# Patient Record
Sex: Male | Born: 1985 | Race: White | Hispanic: No | Marital: Single | Smoking: Current every day smoker
Health system: Southern US, Community
[De-identification: ages and names within clinical notes are randomized; demographics above are authoritative.]

## PROBLEM LIST (undated history)

## (undated) DIAGNOSIS — K5090 Crohn's disease, unspecified, without complications: Secondary | ICD-10-CM

## (undated) DIAGNOSIS — I10 Essential (primary) hypertension: Secondary | ICD-10-CM

## (undated) HISTORY — PX: BACK SURGERY: SHX140

## (undated) HISTORY — PX: OTHER SURGICAL HISTORY: SHX169

---
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
## 2012-04-12 ENCOUNTER — Ambulatory Visit (INDEPENDENT_AMBULATORY_CARE_PROVIDER_SITE_OTHER): Payer: Self-pay | Admitting: General Surgery

## 2012-04-21 ENCOUNTER — Encounter (HOSPITAL_COMMUNITY): Payer: Self-pay | Admitting: Emergency Medicine

## 2012-04-21 ENCOUNTER — Emergency Department (HOSPITAL_COMMUNITY)
Admission: EM | Admit: 2012-04-21 | Discharge: 2012-04-21 | Disposition: A | Discharge status: Home / Self Care | Payer: Managed Care, Other (non HMO) | Attending: Emergency Medicine | Admitting: Emergency Medicine

## 2012-04-21 ENCOUNTER — Emergency Department (HOSPITAL_COMMUNITY): Payer: Managed Care, Other (non HMO)

## 2012-04-21 DIAGNOSIS — R109 Unspecified abdominal pain: Secondary | ICD-10-CM

## 2012-04-21 DIAGNOSIS — R110 Nausea: Secondary | ICD-10-CM

## 2012-04-21 DIAGNOSIS — I10 Essential (primary) hypertension: Secondary | ICD-10-CM | POA: Insufficient documentation

## 2012-04-21 DIAGNOSIS — Z87891 Personal history of nicotine dependence: Secondary | ICD-10-CM | POA: Insufficient documentation

## 2012-04-21 DIAGNOSIS — Z8719 Personal history of other diseases of the digestive system: Secondary | ICD-10-CM | POA: Insufficient documentation

## 2012-04-21 DIAGNOSIS — R1031 Right lower quadrant pain: Secondary | ICD-10-CM | POA: Insufficient documentation

## 2012-04-21 DIAGNOSIS — Z79899 Other long term (current) drug therapy: Secondary | ICD-10-CM | POA: Insufficient documentation

## 2012-04-21 HISTORY — DX: Essential (primary) hypertension: I10

## 2012-04-21 HISTORY — DX: Crohn's disease, unspecified, without complications: K50.90

## 2012-04-21 LAB — CBC WITH DIFFERENTIAL/PLATELET
Basophils Absolute: 0.1 10*3/uL (ref 0.0–0.1)
HCT: 43.9 % (ref 39.0–52.0)
Hemoglobin: 14.5 g/dL (ref 13.0–17.0)
Lymphocytes Relative: 17 % (ref 12–46)
Lymphs Abs: 2 10*3/uL (ref 0.7–4.0)
MCV: 87.3 fL (ref 78.0–100.0)
Monocytes Absolute: 1.4 10*3/uL — ABNORMAL HIGH (ref 0.1–1.0)
Neutro Abs: 8 10*3/uL — ABNORMAL HIGH (ref 1.7–7.7)
RBC: 5.03 MIL/uL (ref 4.22–5.81)
RDW: 13.7 % (ref 11.5–15.5)
WBC: 11.6 10*3/uL — ABNORMAL HIGH (ref 4.0–10.5)

## 2012-04-21 LAB — COMPREHENSIVE METABOLIC PANEL
ALT: 17 U/L (ref 0–53)
AST: 19 U/L (ref 0–37)
CO2: 23 mEq/L (ref 19–32)
Chloride: 102 mEq/L (ref 96–112)
Creatinine, Ser: 0.69 mg/dL (ref 0.50–1.35)
GFR calc Af Amer: >90 mL/min (ref >90)
GFR calc non Af Amer: >90 mL/min (ref >90)
Glucose, Bld: 83 mg/dL (ref 70–99)
Total Bilirubin: 0.9 mg/dL (ref 0.3–1.2)

## 2012-04-21 MED ORDER — ONDANSETRON 4 MG PO TBDP
8.0000 mg | ORAL_TABLET | Freq: Once | ORAL | Status: AC
  Administered 2012-04-21: 8 mg via ORAL
  Filled 2012-04-21: qty 2

## 2012-04-21 MED ORDER — IOHEXOL 300 MG/ML  SOLN
25.0000 mL | INTRAMUSCULAR | Status: AC
  Administered 2012-04-21 (×2): 25 mL via ORAL

## 2012-04-21 MED ORDER — DICYCLOMINE HCL 20 MG PO TABS: 20.0000 mg | ORAL_TABLET | Freq: Two times a day (BID) | ORAL | Status: DC

## 2012-04-21 MED ORDER — HYDROMORPHONE HCL PF 1 MG/ML IJ SOLN
1.0000 mg | Freq: Once | INTRAMUSCULAR | Status: AC
  Administered 2012-04-21: 1 mg via INTRAVENOUS
  Filled 2012-04-21: qty 1

## 2012-04-21 MED ORDER — MORPHINE SULFATE 4 MG/ML IJ SOLN
4.0000 mg | Freq: Once | INTRAMUSCULAR | Status: AC
  Administered 2012-04-21: 4 mg via INTRAVENOUS
  Filled 2012-04-21: qty 1

## 2012-04-21 MED ORDER — ONDANSETRON HCL 4 MG/2ML IJ SOLN
4.0000 mg | Freq: Once | INTRAMUSCULAR | Status: AC
  Administered 2012-04-21: 4 mg via INTRAVENOUS
  Filled 2012-04-21: qty 2

## 2012-04-21 MED ORDER — PROMETHAZINE HCL 25 MG PO TABS: 25.0000 mg | ORAL_TABLET | Freq: Four times a day (QID) | ORAL | Status: DC | PRN

## 2012-04-21 MED ORDER — OXYCODONE-ACETAMINOPHEN 5-325 MG PO TABS: 2.0000 | ORAL_TABLET | ORAL | Status: DC | PRN

## 2012-04-21 MED ORDER — DICYCLOMINE HCL 10 MG/ML IM SOLN
20.0000 mg | Freq: Once | INTRAMUSCULAR | Status: AC
  Administered 2012-04-21: 20 mg via INTRAMUSCULAR
  Filled 2012-04-21: qty 2

## 2012-04-21 MED ORDER — IOHEXOL 300 MG/ML  SOLN
80.0000 mL | Freq: Once | INTRAMUSCULAR | Status: AC | PRN
  Administered 2012-04-21: 80 mL via INTRAVENOUS

## 2012-04-21 NOTE — ED Provider Notes (Signed)
History     CSN: [id number]  Arrival date & time 04/21/12  1036   First MD Initiated Contact with Patient 04/21/12 1042      Chief Complaint  Patient presents with  . Abdominal Pain    (Consider location/radiation/quality/duration/timing/severity/associated sxs/prior treatment) HPI Comments: Patient is a 27 year old male with a past medical history of Crohn's disease and previous ileostomy who presents with a 4 day history of abdominal pain. The pain is located in RLQ and does not radiate. The pain is described as cramping and severe. The pain started gradually and progressively worsened since the onset. No alleviating factors. Aggravating factors include movement requiring abdominal muscles. The patient has tried clear liquid diet for symptoms without relief. Associated symptoms include nausea. Patient denies fever, headache, vomiting, diarrhea, chest pain, SOB, dysuria, constipation.    Patient is a 27 y.o. male presenting with abdominal pain.  Abdominal Pain Associated symptoms: nausea     Past Medical History  Diagnosis Date  . Crohn's disease   . Hypertension     Past Surgical History  Procedure Laterality Date  . Ileostomy placed      March 2012  . Ileostomy reversed      May 2013  . Back surgery      No family history on file.  History  Substance Use Topics  . Smoking status: Former Games developer  . Smokeless tobacco: Not on file  . Alcohol Use: 3.6 oz/week    6 Cans of beer per week      Review of Systems  Gastrointestinal: Positive for nausea and abdominal pain.  All other systems reviewed and are negative.    Allergies  Review of patient's allergies indicates no known allergies.  Home Medications   Current Outpatient Rx  Name  Route  Sig  Dispense  Refill  . adalimumab (HUMIRA) 40 MG/0.8ML injection   Subcutaneous   Inject 40 mg into the skin every 14 (fourteen) days.         Marland Kitchen ibuprofen (ADVIL,MOTRIN) 200 MG tablet   Oral   Take 400 mg by  mouth every 6 (six) hours as needed for pain.         Marland Kitchen zolpidem (AMBIEN CR) 12.5 MG CR tablet   Oral   Take 12.5 mg by mouth at bedtime.           BP 140/87  Pulse 102  Temp(Src) 98.7 F (37.1 C) (Oral)  Resp 16  Ht 5\' 9"  (1.753 m)  Wt 132 lb (59.875 kg)  BMI 19.48 kg/m2  SpO2 100%  Physical Exam  Nursing note and vitals reviewed. Constitutional: He is oriented to person, place, and time. He appears well-developed and well-nourished. No distress.  HENT:  Head: Normocephalic and atraumatic.  Eyes: Conjunctivae and EOM are normal.  Neck: Normal range of motion. Neck supple.  Cardiovascular: Normal rate and regular rhythm.  Exam reveals no gallop and no friction rub.   No murmur heard. Pulmonary/Chest: Effort normal and breath sounds normal. He has no wheezes. He has no rales. He exhibits no tenderness.  Abdominal: Soft. He exhibits no distension. There is tenderness. There is no rebound and no guarding.  Multiple healed surgical scars. RLQ tenderness at McBurney's point.   Musculoskeletal: Normal range of motion.  Neurological: He is alert and oriented to person, place, and time. Coordination normal.  Speech is goal-oriented. Moves limbs without ataxia.   Skin: Skin is warm and dry.  Psychiatric: He has a normal mood and  affect. His behavior is normal.    ED Course  Procedures (including critical care time)  Labs Reviewed  CBC WITH DIFFERENTIAL - Abnormal; Notable for the following:    WBC 11.6 (*)    Neutro Abs 8.0 (*)    Monocytes Absolute 1.4 (*)    All other components within normal limits  COMPREHENSIVE METABOLIC PANEL  LIPASE, BLOOD   Ct Abdomen Pelvis W Contrast  04/21/2012  *RADIOLOGY REPORT*  Clinical Data: Evaluate for appendicitis  CT ABDOMEN AND PELVIS WITH CONTRAST  Technique:  Multidetector CT imaging of the abdomen and pelvis was performed following the standard protocol during bolus administration of intravenous contrast.  Contrast: 80mL OMNIPAQUE  IOHEXOL 300 MG/ML  SOLN  Comparison: None  Findings:  Lung bases appear clear.  No pericardial or pleural effusion. There is no focal liver abnormality.  Gallbladder appears normal. No biliary dilatation.  The pancreas is unremarkable.  Normal appearance of the spleen.  Multiple small stones are identified within both kidneys.  The largest right renal stone is in the upper pole measure the 4 mm, image 26.  The largest left renal stone is in the upper pole measuring 4 mm, image 20.  Bilateral pelvocaliectasis is identified.  There is mild bilateral hydroureter without evidence for obstructing calculus.  Asymmetric wall thickening of the urinary bladder predominately involves the ventral and left lateral walls.  No focal mass lesions identified.  There is a normal caliber of the abdominal aorta.  No upper abdominal adenopathy noted.  There is no pelvic or inguinal adenopathy.  No free fluid or fluid collection within the abdomen or the pelvis.  The stomach appears normal.  Several prominent loops of small bowel are noted measuring up to 3.0 cm, image 37/series 2.  No evidence for high-grade bowel obstruction.   The patient appears to be status post subpleural colectomy, prior ileostomy, ileostomy reversal with ileorectal anastomosis.  There are no abnormal inflammatory changes associated with the bowel loops.  No wall thickening or free fluid noted.  There is laxity of the infra umbilical abdominal wall without evidence for hernia.  There is no hernia with the previous ileostomy site.   No evidence for inguinal hernia.  The patient has a scoliosis deformity.  There are postsurgical changes from the prior hardware fixation of the lumbar spine with placement of a scoliosis rods and screws.  IMPRESSION:  1.  Postoperative anatomy consistent with subtotal colectomy with ileorectal anastomosis. There are a few prominent loops of proximal small bowel without evidence for high-grade obstruction. 2.  Bilateral  nephrolithiasis.  There is bilateral pelvocaliectasis and hydroureter without evidence for obstructing stone or mass. 3.  Asymmetric wall thickening involving the urinary bladder. 4.  No evidence for hernia.   Original Report Authenticated By: Signa Kell, M.D.      1. Abdominal pain   2. Nausea       MDM  12:48 PM Labs pending. Patient will have morphine for pain. CT abdomen pelvis pending.   2:00 PM CT scan unremarkable. Labs unremarkable. I will treat the patient's pain and nausea and discharge him. Vitals stable. Patient afebrile.   2:36 PM Results discussed with patient. He has a follow up appointment with Dr. Dulce Sellar at [facility]. Patient instructed to return with worsening or concerning symptoms.    Emilia Beck, PA-C 04/21/12 1445

## 2012-04-21 NOTE — ED Notes (Signed)
Patient claims had an ileostomy March 2012 and had it reversed May 2013. Patient advised he hasn't had any problems since his reversal.   Patient claims that he is "full of hernias.  I think that I have a problem because I lift a lot".  Patient complains of abdominal pain from mid lower abdomen to right lower abdomen.

## 2012-04-22 ENCOUNTER — Ambulatory Visit (INDEPENDENT_AMBULATORY_CARE_PROVIDER_SITE_OTHER): Payer: Self-pay | Admitting: Surgery

## 2012-04-23 NOTE — ED Provider Notes (Signed)
Medical screening examination/treatment/procedure(s) were conducted as a shared visit with non-physician practitioner(s) and myself.  I personally evaluated the patient during the encounter  Pt stable in the ED.  I spoke to radiology initially about this imaging.    Joya Gaskins, MD 04/23/12 [phone]

## 2013-10-31 ENCOUNTER — Encounter (HOSPITAL_COMMUNITY): Payer: Self-pay | Admitting: Emergency Medicine

## 2013-10-31 ENCOUNTER — Emergency Department (HOSPITAL_COMMUNITY)
Admission: EM | Admit: 2013-10-31 | Discharge: 2013-10-31 | Disposition: A | Discharge status: Home / Self Care | Payer: BC Managed Care – PPO | Attending: Family Medicine | Admitting: Family Medicine

## 2013-10-31 DIAGNOSIS — W261XXA Contact with sword or dagger, initial encounter: Secondary | ICD-10-CM

## 2013-10-31 DIAGNOSIS — Z79899 Other long term (current) drug therapy: Secondary | ICD-10-CM | POA: Diagnosis not present

## 2013-10-31 DIAGNOSIS — F17200 Nicotine dependence, unspecified, uncomplicated: Secondary | ICD-10-CM | POA: Insufficient documentation

## 2013-10-31 DIAGNOSIS — I10 Essential (primary) hypertension: Secondary | ICD-10-CM | POA: Insufficient documentation

## 2013-10-31 DIAGNOSIS — W260XXA Contact with knife, initial encounter: Secondary | ICD-10-CM | POA: Diagnosis not present

## 2013-10-31 DIAGNOSIS — Z23 Encounter for immunization: Secondary | ICD-10-CM | POA: Insufficient documentation

## 2013-10-31 DIAGNOSIS — Z8719 Personal history of other diseases of the digestive system: Secondary | ICD-10-CM | POA: Insufficient documentation

## 2013-10-31 DIAGNOSIS — S51809A Unspecified open wound of unspecified forearm, initial encounter: Secondary | ICD-10-CM | POA: Diagnosis present

## 2013-10-31 DIAGNOSIS — Y9389 Activity, other specified: Secondary | ICD-10-CM | POA: Insufficient documentation

## 2013-10-31 DIAGNOSIS — S41112A Laceration without foreign body of left upper arm, initial encounter: Secondary | ICD-10-CM

## 2013-10-31 DIAGNOSIS — Y929 Unspecified place or not applicable: Secondary | ICD-10-CM | POA: Insufficient documentation

## 2013-10-31 MED ORDER — TETANUS-DIPHTH-ACELL PERTUSSIS 5-2.5-18.5 LF-MCG/0.5 IM SUSP
0.5000 mL | Freq: Once | INTRAMUSCULAR | Status: AC
  Administered 2013-10-31: 0.5 mL via INTRAMUSCULAR
  Filled 2013-10-31: qty 0.5

## 2013-10-31 NOTE — Discharge Instructions (Signed)
Keep clean and dry, return 10-14 d for suture removal , sooner if any problems.

## 2013-10-31 NOTE — ED Notes (Signed)
Pt is here with left forearm 1.5 inch lac with some bleeding from cutting it with a razor blade while working on car.  Unintentional.

## 2013-10-31 NOTE — ED Provider Notes (Signed)
CSN: [id number]     Arrival date & time 10/31/13  1512 History   First MD Initiated Contact with Patient 10/31/13 1612     Chief Complaint  Patient presents with  . Laceration     (Consider location/radiation/quality/duration/timing/severity/associated sxs/prior Treatment) Patient is a 28 y.o. male presenting with skin laceration. The history is provided by the patient and a parent.  Laceration Location:  Shoulder/arm Shoulder/arm laceration location:  L forearm Length (cm):  2.5 Depth:  Cutaneous Quality: straight   Bleeding: controlled   Time since incident:  1 hour Laceration mechanism:  Knife Pain details:    Quality:  Sharp   Severity:  Mild   Progression:  Unchanged Foreign body present:  No foreign bodies Worsened by:  Nothing tried Ineffective treatments:  None tried Tetanus status:  Out of date   Past Medical History  Diagnosis Date  . Crohn's disease   . Hypertension    Past Surgical History  Procedure Laterality Date  . Ileostomy placed      March 2012  . Ileostomy reversed      May 2013  . Back surgery     No family history on file. History  Substance Use Topics  . Smoking status: Current Every Day Smoker  . Smokeless tobacco: Not on file  . Alcohol Use: 3.6 oz/week    6 Cans of beer per week     Comment: daily    Review of Systems    Allergies  Review of patient's allergies indicates no known allergies.  Home Medications   Prior to Admission medications   Medication Sig Start Date End Date Taking? Authorizing Provider  adalimumab (HUMIRA) 40 MG/0.8ML injection Inject 40 mg into the skin every 14 (fourteen) days.    Historical Provider, MD  dicyclomine (BENTYL) 20 MG tablet Take 1 tablet (20 mg total) by mouth 2 (two) times daily. 04/21/12   Kaitlyn Szekalski, PA-C  ibuprofen (ADVIL,MOTRIN) 200 MG tablet Take 400 mg by mouth every 6 (six) hours as needed for pain.    Historical Provider, MD  oxyCODONE-acetaminophen (PERCOCET/ROXICET) 5-325  MG per tablet Take 2 tablets by mouth every 4 (four) hours as needed for pain. 04/21/12   Kaitlyn Szekalski, PA-C  promethazine (PHENERGAN) 25 MG tablet Take 1 tablet (25 mg total) by mouth every 6 (six) hours as needed for nausea. 04/21/12   Kaitlyn Szekalski, PA-C  zolpidem (AMBIEN CR) 12.5 MG CR tablet Take 12.5 mg by mouth at bedtime.    Historical Provider, MD   BP 142/83  Pulse 82  Temp(Src) 98.3 F (36.8 C) (Oral)  Resp 18  Ht 5\' 9"  (1.753 m)  Wt 145 lb (65.772 kg)  BMI 21.40 kg/m2  SpO2 97% Physical Exam  ED Course  Procedures (including critical care time) Labs Review Labs Reviewed - No data to display  Imaging Review No results found.   EKG Interpretation None      MDM   Final diagnoses:  Arm laceration, left, initial encounter        Linna Hoff, MD 10/31/13 [phone]

## 2013-10-31 NOTE — ED Notes (Signed)
Pt verbalizes understanding of d/c instructions and denies any further needs at this time. 

## 2015-04-18 ENCOUNTER — Emergency Department (HOSPITAL_COMMUNITY)
Admission: EM | Admit: 2015-04-18 | Discharge: 2015-04-19 | Disposition: A | Discharge status: Home / Self Care | Payer: BLUE CROSS/BLUE SHIELD | Attending: Emergency Medicine | Admitting: Emergency Medicine

## 2015-04-18 ENCOUNTER — Encounter (HOSPITAL_COMMUNITY): Payer: Self-pay | Admitting: Emergency Medicine

## 2015-04-18 DIAGNOSIS — F17200 Nicotine dependence, unspecified, uncomplicated: Secondary | ICD-10-CM | POA: Diagnosis not present

## 2015-04-18 DIAGNOSIS — I10 Essential (primary) hypertension: Secondary | ICD-10-CM | POA: Insufficient documentation

## 2015-04-18 DIAGNOSIS — R197 Diarrhea, unspecified: Secondary | ICD-10-CM | POA: Diagnosis not present

## 2015-04-18 DIAGNOSIS — Z9049 Acquired absence of other specified parts of digestive tract: Secondary | ICD-10-CM | POA: Diagnosis not present

## 2015-04-18 DIAGNOSIS — R112 Nausea with vomiting, unspecified: Secondary | ICD-10-CM | POA: Diagnosis present

## 2015-04-18 DIAGNOSIS — R110 Nausea: Secondary | ICD-10-CM | POA: Diagnosis not present

## 2015-04-18 DIAGNOSIS — R252 Cramp and spasm: Secondary | ICD-10-CM | POA: Diagnosis not present

## 2015-04-18 DIAGNOSIS — R1013 Epigastric pain: Secondary | ICD-10-CM | POA: Insufficient documentation

## 2015-04-18 DIAGNOSIS — Z8719 Personal history of other diseases of the digestive system: Secondary | ICD-10-CM | POA: Insufficient documentation

## 2015-04-18 DIAGNOSIS — Z79899 Other long term (current) drug therapy: Secondary | ICD-10-CM | POA: Insufficient documentation

## 2015-04-18 LAB — COMPREHENSIVE METABOLIC PANEL
ALBUMIN: 4.4 g/dL (ref 3.5–5.0)
ALT: 24 U/L (ref 17–63)
ANION GAP: 18 — AB (ref 5–15)
AST: 37 U/L (ref 15–41)
Alkaline Phosphatase: 78 U/L (ref 38–126)
BILIRUBIN TOTAL: 0.8 mg/dL (ref 0.3–1.2)
BUN: 13 mg/dL (ref 6–20)
CHLORIDE: 92 mmol/L — AB (ref 101–111)
CO2: 21 mmol/L — ABNORMAL LOW (ref 22–32)
Calcium: 9.5 mg/dL (ref 8.9–10.3)
Creatinine, Ser: 1.34 mg/dL — ABNORMAL HIGH (ref 0.61–1.24)
GFR calc Af Amer: >60 mL/min (ref >60)
GFR calc non Af Amer: >60 mL/min (ref >60)
GLUCOSE: 115 mg/dL — AB (ref 65–99)
POTASSIUM: 3.3 mmol/L — AB (ref 3.5–5.1)
SODIUM: 131 mmol/L — AB (ref 135–145)
TOTAL PROTEIN: 9.4 g/dL — AB (ref 6.5–8.1)

## 2015-04-18 LAB — URINALYSIS, ROUTINE W REFLEX MICROSCOPIC
BILIRUBIN URINE: NEGATIVE
Glucose, UA: NEGATIVE mg/dL
Ketones, ur: NEGATIVE mg/dL
LEUKOCYTES UA: NEGATIVE
NITRITE: NEGATIVE
PROTEIN: 100 mg/dL — AB
SPECIFIC GRAVITY, URINE: 1.021 (ref 1.005–1.030)
pH: 6 (ref 5.0–8.0)

## 2015-04-18 LAB — CBC
HEMATOCRIT: 51 % (ref 39.0–52.0)
HEMOGLOBIN: 17.4 g/dL — AB (ref 13.0–17.0)
MCH: 30.8 pg (ref 26.0–34.0)
MCHC: 34.1 g/dL (ref 30.0–36.0)
MCV: 90.3 fL (ref 78.0–100.0)
Platelets: 245 10*3/uL (ref 150–400)
RBC: 5.65 MIL/uL (ref 4.22–5.81)
RDW: 12 % (ref 11.5–15.5)
WBC: 9.8 10*3/uL (ref 4.0–10.5)

## 2015-04-18 LAB — LIPASE, BLOOD: LIPASE: 22 U/L (ref 11–51)

## 2015-04-18 LAB — URINE MICROSCOPIC-ADD ON

## 2015-04-18 MED ORDER — POTASSIUM CHLORIDE 10 MEQ/100ML IV SOLN
10.0000 meq | Freq: Once | INTRAVENOUS | Status: AC
  Administered 2015-04-18: 10 meq via INTRAVENOUS
  Filled 2015-04-18: qty 100

## 2015-04-18 MED ORDER — ONDANSETRON HCL 4 MG/2ML IJ SOLN
4.0000 mg | Freq: Once | INTRAMUSCULAR | Status: AC
  Administered 2015-04-18: 4 mg via INTRAVENOUS
  Filled 2015-04-18: qty 2

## 2015-04-18 MED ORDER — POTASSIUM CHLORIDE CRYS ER 20 MEQ PO TBCR
40.0000 meq | EXTENDED_RELEASE_TABLET | Freq: Once | ORAL | Status: AC
  Administered 2015-04-18: 40 meq via ORAL
  Filled 2015-04-18: qty 2

## 2015-04-18 MED ORDER — SODIUM CHLORIDE 0.9 % IV BOLUS (SEPSIS)
2000.0000 mL | Freq: Once | INTRAVENOUS | Status: AC
  Administered 2015-04-18: 2000 mL via INTRAVENOUS

## 2015-04-18 NOTE — ED Provider Notes (Signed)
CSN: [id number]     Arrival date & time 04/18/15  2123 History   By signing my name below, I, Arlan Organ, attest that this documentation has been prepared under the direction and in the presence of Laurence Spates, MD.  Electronically Signed: Arlan Organ, ED Scribe. 04/18/2015. 11:30 PM.   Chief Complaint  Patient presents with  . Nausea  . Spasms   The history is provided by the patient. No language interpreter was used.    HPI Comments: Juan Ingram is a 30 y.o. male with a PMHx of Crohn's Disease s/p colectomy and HTN who presents to the Emergency Department complaining of constant, ongoing nausea with occasional vomiting for 1 week. Pt reports 2-3 episodes of vomiting since onset of symptoms. Nausea is exacerbated with food, liquids, and when feeling hot. No alleviating factors at this time. Pt also reports ongoing non-bloody diarrhea, upper abdominal pain, and generalized muscle cramping. OTC Pepto Bismol without any improvement. No recent fever, chills, dysuria, or hematuria. Last steroid use in 2012. He admits to being a heavy drinker- approximately 6 beers daily. Pt has been off of Humera consistently for 5-6 months but admits to taking 1 injection in December of 2016. Mother was recently sick with a similar GI illness in the last several days. Next follow up with GI on March 1. PSHx includes ileostomy placement 05/2010, ileostomy reversed 07/2011.  PCP: Farris Has, MD   GASTROENTEROLOGIST: Dr. Dulce Sellar with [facility]  Past Medical History  Diagnosis Date  . Crohn's disease (HCC)   . Hypertension    Past Surgical History  Procedure Laterality Date  . Ileostomy placed      March 2012  . Ileostomy reversed      May 2013  . Back surgery     No family history on file. Social History  Substance Use Topics  . Smoking status: Current Every Day Smoker  . Smokeless tobacco: None  . Alcohol Use: 3.6 oz/week    6 Cans of beer per week     Comment: daily    Review of  Systems  A complete 10 system review of systems was obtained and all systems are negative except as noted in the HPI and PMH.    Allergies  Review of patient's allergies indicates no known allergies.  Home Medications   Prior to Admission medications   Medication Sig Start Date End Date Taking? Authorizing Provider  eszopiclone (LUNESTA) 1 MG TABS tablet Take 1 mg by mouth at bedtime. 03/20/15  Yes Historical Provider, MD  ondansetron (ZOFRAN) 8 MG tablet Take 8 mg by mouth every 8 (eight) hours as needed for nausea.  04/17/15  Yes Historical Provider, MD  dicyclomine (BENTYL) 20 MG tablet Take 1 tablet (20 mg total) by mouth 2 (two) times daily. Patient not taking: Reported on 04/18/2015 04/21/12   Emilia Beck, PA-C  omeprazole (PRILOSEC) 20 MG capsule Take 1 capsule (20 mg total) by mouth daily. 04/19/15   Laurence Spates, MD  promethazine (PHENERGAN) 25 MG tablet Take 1 tablet (25 mg total) by mouth every 6 (six) hours as needed for nausea. Patient not taking: Reported on 04/18/2015 04/21/12   Emilia Beck, PA-C  sucralfate (CARAFATE) 1 g tablet Take 1 tablet (1 g total) by mouth 4 (four) times daily -  with meals and at bedtime. 04/19/15   Laurence Spates, MD   Triage Vitals: BP 108/87 mmHg  Pulse 73  Temp(Src) 97.9 F (36.6 C) (Oral)  Resp 16  Ht '5\' 5"'$  (1.651 m)  Wt 145 lb (65.772 kg)  BMI 24.13 kg/m2  SpO2 95%   Physical Exam  Constitutional: He is oriented to person, place, and time. He appears well-developed and well-nourished. No distress.  HENT:  Head: Normocephalic and atraumatic.  Moist mucous membranes  Eyes: Conjunctivae are normal. Pupils are equal, round, and reactive to light.  Neck: Neck supple.  Cardiovascular: Normal rate, regular rhythm and normal heart sounds.   No murmur heard. Pulmonary/Chest: Effort normal and breath sounds normal.  Abdominal: Soft. Bowel sounds are normal. He exhibits no distension. There is tenderness. There is no rebound  and no guarding.  Tenderness to palpation of mid epigastrium over well healed abdominal scar No palpable hernia  Musculoskeletal: He exhibits no edema.  Neurological: He is alert and oriented to person, place, and time.  Fluent speech  Skin: Skin is warm and dry.  Psychiatric: He has a normal mood and affect. Judgment normal.  Nursing note and vitals reviewed.   ED Course  Procedures (including critical care time)  DIAGNOSTIC STUDIES: Oxygen Saturation is 97% on RA, adequate by my interpretation.    COORDINATION OF CARE: 11:24 PM- Will give Zofran, and fluids. Will order sedimentation rate, lipase, CMP, CBC, and urinalysis. Discussed treatment plan with pt at bedside and pt agreed to plan.     Labs Review Labs Reviewed  COMPREHENSIVE METABOLIC PANEL - Abnormal; Notable for the following:    Sodium 131 (*)    Potassium 3.3 (*)    Chloride 92 (*)    CO2 21 (*)    Glucose, Bld 115 (*)    Creatinine, Ser 1.34 (*)    Total Protein 9.4 (*)    Anion gap 18 (*)    All other components within normal limits  CBC - Abnormal; Notable for the following:    Hemoglobin 17.4 (*)    All other components within normal limits  URINALYSIS, ROUTINE W REFLEX MICROSCOPIC (NOT AT [facility]) - Abnormal; Notable for the following:    Color, Urine AMBER (*)    APPearance CLOUDY (*)    Hgb urine dipstick MODERATE (*)    Protein, ur 100 (*)    All other components within normal limits  URINE MICROSCOPIC-ADD ON - Abnormal; Notable for the following:    Squamous Epithelial / LPF 0-5 (*)    Bacteria, UA RARE (*)    Casts HYALINE CASTS (*)    All other components within normal limits  LIPASE, BLOOD  SEDIMENTATION RATE    Imaging Review No results found. I have personally reviewed and evaluated these lab results as part of my medical decision-making.   EKG Interpretation None     Medications  sodium chloride 0.9 % bolus 2,000 mL (0 mLs Intravenous Stopped 04/19/15 0307)  potassium chloride 10 mEq  in 100 mL IVPB (0 mEq Intravenous Stopped 04/19/15 0036)  potassium chloride SA (K-DUR,KLOR-CON) CR tablet 40 mEq (40 mEq Oral Given 04/18/15 2336)  ondansetron (ZOFRAN) injection 4 mg (4 mg Intravenous Given 04/18/15 2341)    MDM   Final diagnoses:  Diarrhea, unspecified type  Nausea  Pt w/ h/o Crohn's disease and daily alcohol use p/w 1 week of nausea and occasional vomiting as well as several days of non-bloody diarrhea. He also notes generalized muscle cramping and spasms today. On exam he was awake, alert, and in no acute distress. Vital signs at triage were notable for tachycardia, however he had normal vital signs during my examination. Midepigastric tenderness noted  without rebound or guarding. Gave the patient Zofran and an IV fluid bolus and obtained above lab work. Labs showed mild dehydration with creatinine of 1.34, potassium 3.3. Gave the patient oral and IV potassium repletion, which I suspect may be the cause of his cramping in addition to dehydration. His UA contained trace amount of RBCs; I discussed with patient the importance of having repeat UA at PCP office to ensure resolution. WBC count normal and ESR also normal, thus I feel that Crohn's flare is unlikely. Given that the patient lives with his mother who has been sick with GI illness, I suspect gastroenteritis may be the cause of the patient's diarrhea. Regarding his nausea and epigastric pain, he drinks at least 6 beers daily and he may have a component of gastritis versus PUD. I have emphasized importance of alcohol cessation and given Rx for prilosec and carafate. Instructed to contact gastroenterologist for f/u. On reexamination, he is well-appearing without any vomiting in the ED. I discussed supportive care instructions for diarrhea and reviewed return precautions including any worsening abdominal pain, bloody stools, or fever. The patient voiced understanding and was discharged in satisfactory condition.  I personally performed  the services described in this documentation, which was scribed in my presence. The recorded information has been reviewed and is accurate.   Sharlett Iles, MD 04/19/15 [phone]

## 2015-04-18 NOTE — ED Notes (Signed)
Pt states since Tuesday he has been having intermittent nausea and diarrhea. Pt states this morning he started having muscle cramps all over and spasms. Pt also admits to drinking everyday for last 4 years. Pt states he normally drinks 6 beer a day. Last beer this evening.

## 2015-04-19 LAB — SEDIMENTATION RATE: Sed Rate: 15 mm/hr (ref 0–16)

## 2015-04-19 MED ORDER — SUCRALFATE 1 G PO TABS: 1.0000 g | ORAL_TABLET | Freq: Three times a day (TID) | ORAL | Status: DC

## 2015-04-19 MED ORDER — OMEPRAZOLE 20 MG PO CPDR: 20.0000 mg | DELAYED_RELEASE_CAPSULE | Freq: Every day | ORAL | Status: AC

## 2015-06-07 ENCOUNTER — Inpatient Hospital Stay (HOSPITAL_COMMUNITY)
Admission: EM | Admit: 2015-06-07 | Discharge: 2015-06-12 | DRG: 392 | Disposition: A | Discharge status: Home / Self Care | Payer: BLUE CROSS/BLUE SHIELD | Attending: Internal Medicine | Admitting: Internal Medicine

## 2015-06-07 ENCOUNTER — Encounter (HOSPITAL_COMMUNITY): Payer: Self-pay | Admitting: Emergency Medicine

## 2015-06-07 ENCOUNTER — Emergency Department (HOSPITAL_COMMUNITY): Payer: BLUE CROSS/BLUE SHIELD

## 2015-06-07 DIAGNOSIS — E871 Hypo-osmolality and hyponatremia: Secondary | ICD-10-CM | POA: Diagnosis present

## 2015-06-07 DIAGNOSIS — N183 Chronic kidney disease, stage 3 unspecified: Secondary | ICD-10-CM | POA: Insufficient documentation

## 2015-06-07 DIAGNOSIS — F17200 Nicotine dependence, unspecified, uncomplicated: Secondary | ICD-10-CM | POA: Diagnosis present

## 2015-06-07 DIAGNOSIS — E876 Hypokalemia: Secondary | ICD-10-CM | POA: Diagnosis not present

## 2015-06-07 DIAGNOSIS — R109 Unspecified abdominal pain: Secondary | ICD-10-CM | POA: Diagnosis not present

## 2015-06-07 DIAGNOSIS — Z9049 Acquired absence of other specified parts of digestive tract: Secondary | ICD-10-CM | POA: Diagnosis not present

## 2015-06-07 DIAGNOSIS — K50919 Crohn's disease, unspecified, with unspecified complications: Secondary | ICD-10-CM

## 2015-06-07 DIAGNOSIS — Z79899 Other long term (current) drug therapy: Secondary | ICD-10-CM

## 2015-06-07 DIAGNOSIS — A080 Rotaviral enteritis: Principal | ICD-10-CM

## 2015-06-07 DIAGNOSIS — N179 Acute kidney failure, unspecified: Secondary | ICD-10-CM | POA: Diagnosis present

## 2015-06-07 DIAGNOSIS — R1084 Generalized abdominal pain: Secondary | ICD-10-CM | POA: Diagnosis not present

## 2015-06-07 DIAGNOSIS — E861 Hypovolemia: Secondary | ICD-10-CM | POA: Diagnosis present

## 2015-06-07 DIAGNOSIS — D72829 Elevated white blood cell count, unspecified: Secondary | ICD-10-CM | POA: Diagnosis not present

## 2015-06-07 DIAGNOSIS — A084 Viral intestinal infection, unspecified: Secondary | ICD-10-CM | POA: Diagnosis not present

## 2015-06-07 DIAGNOSIS — I129 Hypertensive chronic kidney disease with stage 1 through stage 4 chronic kidney disease, or unspecified chronic kidney disease: Secondary | ICD-10-CM | POA: Diagnosis present

## 2015-06-07 DIAGNOSIS — K5010 Crohn's disease of large intestine without complications: Secondary | ICD-10-CM | POA: Diagnosis present

## 2015-06-07 DIAGNOSIS — K5090 Crohn's disease, unspecified, without complications: Secondary | ICD-10-CM | POA: Diagnosis not present

## 2015-06-07 DIAGNOSIS — K50119 Crohn's disease of large intestine with unspecified complications: Secondary | ICD-10-CM | POA: Diagnosis not present

## 2015-06-07 DIAGNOSIS — E860 Dehydration: Secondary | ICD-10-CM | POA: Diagnosis not present

## 2015-06-07 DIAGNOSIS — R197 Diarrhea, unspecified: Secondary | ICD-10-CM | POA: Diagnosis not present

## 2015-06-07 DIAGNOSIS — K921 Melena: Secondary | ICD-10-CM | POA: Diagnosis not present

## 2015-06-07 LAB — CBC
HCT: 49.2 % (ref 39.0–52.0)
Hemoglobin: 16.7 g/dL (ref 13.0–17.0)
MCH: 31.2 pg (ref 26.0–34.0)
MCHC: 33.9 g/dL (ref 30.0–36.0)
MCV: 92 fL (ref 78.0–100.0)
PLATELETS: 339 10*3/uL (ref 150–400)
RBC: 5.35 MIL/uL (ref 4.22–5.81)
RDW: 12.7 % (ref 11.5–15.5)
WBC: 15.1 10*3/uL — ABNORMAL HIGH (ref 4.0–10.5)

## 2015-06-07 LAB — CREATININE, URINE, RANDOM: Creatinine, Urine: 711.41 mg/dL

## 2015-06-07 LAB — COMPREHENSIVE METABOLIC PANEL
ALK PHOS: 90 U/L (ref 38–126)
ALT: 21 U/L (ref 17–63)
AST: 32 U/L (ref 15–41)
Albumin: 4.8 g/dL (ref 3.5–5.0)
Anion gap: 20 — ABNORMAL HIGH (ref 5–15)
BUN: 9 mg/dL (ref 6–20)
CALCIUM: 10.1 mg/dL (ref 8.9–10.3)
CO2: 17 mmol/L — AB (ref 22–32)
CREATININE: 2.36 mg/dL — AB (ref 0.61–1.24)
Chloride: 97 mmol/L — ABNORMAL LOW (ref 101–111)
GFR calc non Af Amer: 35 mL/min — ABNORMAL LOW (ref >60)
GFR, EST AFRICAN AMERICAN: 41 mL/min — AB (ref >60)
GLUCOSE: 146 mg/dL — AB (ref 65–99)
Potassium: 4 mmol/L (ref 3.5–5.1)
SODIUM: 134 mmol/L — AB (ref 135–145)
Total Bilirubin: 0.6 mg/dL (ref 0.3–1.2)
Total Protein: 10 g/dL — ABNORMAL HIGH (ref 6.5–8.1)

## 2015-06-07 LAB — C DIFFICILE QUICK SCREEN W PCR REFLEX
C DIFFICILE (CDIFF) INTERP: NEGATIVE
C DIFFICILE (CDIFF) TOXIN: NEGATIVE
C Diff antigen: NEGATIVE

## 2015-06-07 LAB — GASTROINTESTINAL PANEL BY PCR, STOOL (REPLACES STOOL CULTURE)
ADENOVIRUS F40/41: NOT DETECTED
Astrovirus: NOT DETECTED
CAMPYLOBACTER SPECIES: NOT DETECTED
Cryptosporidium: NOT DETECTED
Cyclospora cayetanensis: NOT DETECTED
E. coli O157: NOT DETECTED
ENTEROAGGREGATIVE E COLI (EAEC): NOT DETECTED
ENTEROPATHOGENIC E COLI (EPEC): NOT DETECTED
ENTEROTOXIGENIC E COLI (ETEC): NOT DETECTED
Entamoeba histolytica: NOT DETECTED
GIARDIA LAMBLIA: NOT DETECTED
NOROVIRUS GI/GII: NOT DETECTED
PLESIMONAS SHIGELLOIDES: NOT DETECTED
ROTAVIRUS A: DETECTED — AB
SHIGA LIKE TOXIN PRODUCING E COLI (STEC): NOT DETECTED
Salmonella species: NOT DETECTED
Sapovirus (I, II, IV, and V): NOT DETECTED
Shigella/Enteroinvasive E coli (EIEC): NOT DETECTED
Vibrio cholerae: NOT DETECTED
Vibrio species: NOT DETECTED
Yersinia enterocolitica: NOT DETECTED

## 2015-06-07 LAB — URINALYSIS, ROUTINE W REFLEX MICROSCOPIC
Glucose, UA: NEGATIVE mg/dL
HGB URINE DIPSTICK: NEGATIVE
KETONES UR: 15 mg/dL — AB
Nitrite: NEGATIVE
PROTEIN: 30 mg/dL — AB
Specific Gravity, Urine: 1.028 (ref 1.005–1.030)
pH: 5 (ref 5.0–8.0)

## 2015-06-07 LAB — URINE MICROSCOPIC-ADD ON

## 2015-06-07 LAB — MRSA PCR SCREENING: MRSA BY PCR: NEGATIVE

## 2015-06-07 LAB — SODIUM, URINE, RANDOM

## 2015-06-07 LAB — LIPASE, BLOOD: Lipase: 24 U/L (ref 11–51)

## 2015-06-07 LAB — GLUCOSE, CAPILLARY: GLUCOSE-CAPILLARY: 142 mg/dL — AB (ref 65–99)

## 2015-06-07 MED ORDER — ONDANSETRON HCL 4 MG PO TABS
4.0000 mg | ORAL_TABLET | Freq: Four times a day (QID) | ORAL | Status: DC | PRN
Start: 2015-06-07 — End: 2015-06-12
  Administered 2015-06-10: 4 mg via ORAL
  Filled 2015-06-07: qty 1

## 2015-06-07 MED ORDER — SACCHAROMYCES BOULARDII 250 MG PO CAPS
250.0000 mg | ORAL_CAPSULE | Freq: Two times a day (BID) | ORAL | Status: DC
  Administered 2015-06-07 – 2015-06-12 (×11): 250 mg via ORAL
  Filled 2015-06-07 (×11): qty 1

## 2015-06-07 MED ORDER — ACETAMINOPHEN 325 MG PO TABS
650.0000 mg | ORAL_TABLET | Freq: Four times a day (QID) | ORAL | Status: DC | PRN
  Administered 2015-06-07: 650 mg via ORAL
  Filled 2015-06-07: qty 2

## 2015-06-07 MED ORDER — METHYLPREDNISOLONE SODIUM SUCC 125 MG IJ SOLR
125.0000 mg | Freq: Once | INTRAMUSCULAR | Status: AC
  Administered 2015-06-07: 125 mg via INTRAVENOUS
  Filled 2015-06-07: qty 2

## 2015-06-07 MED ORDER — ACETAMINOPHEN 650 MG RE SUPP: 650.0000 mg | Freq: Four times a day (QID) | RECTAL | Status: DC | PRN

## 2015-06-07 MED ORDER — SODIUM CHLORIDE 0.9 % IV SOLN
INTRAVENOUS | Status: DC
  Administered 2015-06-07 – 2015-06-12 (×9): via INTRAVENOUS

## 2015-06-07 MED ORDER — HYDROMORPHONE HCL 1 MG/ML IJ SOLN
1.0000 mg | INTRAMUSCULAR | Status: DC | PRN
  Administered 2015-06-07 – 2015-06-12 (×24): 1 mg via INTRAVENOUS
  Filled 2015-06-07 (×25): qty 1

## 2015-06-07 MED ORDER — ZOLPIDEM TARTRATE 5 MG PO TABS
5.0000 mg | ORAL_TABLET | Freq: Every evening | ORAL | Status: DC | PRN
  Administered 2015-06-08 – 2015-06-12 (×5): 5 mg via ORAL
  Filled 2015-06-07 (×6): qty 1

## 2015-06-07 MED ORDER — RISAQUAD PO CAPS
1.0000 | ORAL_CAPSULE | Freq: Every day | ORAL | Status: DC
  Administered 2015-06-07 – 2015-06-09 (×3): 1 via ORAL
  Filled 2015-06-07 (×3): qty 1

## 2015-06-07 MED ORDER — ONDANSETRON HCL 4 MG/2ML IJ SOLN
4.0000 mg | Freq: Once | INTRAMUSCULAR | Status: AC
  Administered 2015-06-07: 4 mg via INTRAVENOUS
  Filled 2015-06-07: qty 2

## 2015-06-07 MED ORDER — METHYLPREDNISOLONE SODIUM SUCC 40 MG IJ SOLR
40.0000 mg | Freq: Two times a day (BID) | INTRAMUSCULAR | Status: DC
  Administered 2015-06-07 – 2015-06-08 (×4): 40 mg via INTRAVENOUS
  Filled 2015-06-07 (×5): qty 1

## 2015-06-07 MED ORDER — MORPHINE SULFATE (PF) 4 MG/ML IV SOLN
4.0000 mg | Freq: Once | INTRAVENOUS | Status: AC
  Administered 2015-06-07: 4 mg via INTRAVENOUS
  Filled 2015-06-07: qty 1

## 2015-06-07 MED ORDER — ONDANSETRON HCL 4 MG/2ML IJ SOLN
4.0000 mg | Freq: Four times a day (QID) | INTRAMUSCULAR | Status: DC | PRN
  Administered 2015-06-07 – 2015-06-12 (×14): 4 mg via INTRAVENOUS
  Filled 2015-06-07 (×15): qty 2

## 2015-06-07 MED ORDER — PANTOPRAZOLE SODIUM 40 MG PO TBEC
40.0000 mg | DELAYED_RELEASE_TABLET | Freq: Every day | ORAL | Status: DC
  Administered 2015-06-07 – 2015-06-09 (×3): 40 mg via ORAL
  Filled 2015-06-07 (×4): qty 1

## 2015-06-07 NOTE — ED Provider Notes (Signed)
CSN: [id number]     Arrival date & time 06/07/15  0202 History  By signing my name below, I, Arianna Nassar, attest that this documentation has been prepared under the direction and in the presence of Geoffery Lyons, MD. Electronically Signed: Octavia Heir, ED Scribe. 06/07/2015. 3:34 AM.    Chief Complaint  Patient presents with  . Abdominal Pain  . Emesis  . Diarrhea      The history is provided by the patient. No language interpreter was used.   HPI Comments: Juan Ingram is a 30 y.o. male who has a hx of Crohn's disease and HTN presents to the Emergency Department complaining of constant, gradual worsening, moderate, generalized abdominal pain with associated dysuria, frequency, vomiting and diarrhea onset yesterday morning. Pt has not been eating or drinking much and states he is very dehydrated. He says his stool is very watery and clear currently but for the past week he had seen a significant amount of blood in his stool. Pt has a hx of a blockage in his abdomen and endorses that he is worried that there might be another blockage. He notes this this episode feels like a flare up of his Crohn's disease. He states the last time he was hospitalized for a Crohn's flare up was 2012. Denies fever, chills, and body aches.   Past Medical History  Diagnosis Date  . Crohn's disease (HCC)   . Hypertension    Past Surgical History  Procedure Laterality Date  . Ileostomy placed      March 2012  . Ileostomy reversed      May 2013  . Back surgery     No family history on file. Social History  Substance Use Topics  . Smoking status: Current Every Day Smoker  . Smokeless tobacco: None  . Alcohol Use: Yes    Review of Systems  A complete 10 system review of systems was obtained and all systems are negative except as noted in the HPI and PMH.    Allergies  Review of patient's allergies indicates no known allergies.  Home Medications   Prior to Admission medications    Medication Sig Start Date End Date Taking? Authorizing Provider  dicyclomine (BENTYL) 20 MG tablet Take 1 tablet (20 mg total) by mouth 2 (two) times daily. Patient not taking: Reported on 04/18/2015 04/21/12   Emilia Beck, PA-C  eszopiclone (LUNESTA) 1 MG TABS tablet Take 1 mg by mouth at bedtime. 03/20/15   Historical Provider, MD  omeprazole (PRILOSEC) 20 MG capsule Take 1 capsule (20 mg total) by mouth daily. 04/19/15   Laurence Spates, MD  ondansetron (ZOFRAN) 8 MG tablet Take 8 mg by mouth every 8 (eight) hours as needed for nausea.  04/17/15   Historical Provider, MD  promethazine (PHENERGAN) 25 MG tablet Take 1 tablet (25 mg total) by mouth every 6 (six) hours as needed for nausea. Patient not taking: Reported on 04/18/2015 04/21/12   Emilia Beck, PA-C  sucralfate (CARAFATE) 1 g tablet Take 1 tablet (1 g total) by mouth 4 (four) times daily -  with meals and at bedtime. 04/19/15   Laurence Spates, MD   Triage vitals: BP 115/81 mmHg  Pulse 119  Temp(Src) 97.9 F (36.6 C) (Oral)  Resp 20  SpO2 96% Physical Exam  Constitutional: He is oriented to person, place, and time. He appears well-developed and well-nourished.  HENT:  Head: Normocephalic.  Dry mucus membranes  Eyes: EOM are normal.  Neck: Normal range of  motion.  Pulmonary/Chest: Effort normal.  Abdominal: Soft. Bowel sounds are normal. He exhibits no distension. There is tenderness. There is no rebound and no guarding.  TTP in all 4 quadrants. Most TTP in the epigastric region  Musculoskeletal: Normal range of motion.  Neurological: He is alert and oriented to person, place, and time.  Psychiatric: He has a normal mood and affect.  Nursing note and vitals reviewed.   ED Course  Procedures  DIAGNOSTIC STUDIES: Oxygen Saturation is 96% on RA, normal by my interpretation.  COORDINATION OF CARE:  3:31 AM Will order x-ray of abdomen. Discussed treatment plan which includes nausea and pain medication with pt  at bedside and pt agreed to plan.  Labs Review Labs Reviewed  CBC - Abnormal; Notable for the following:    WBC 15.1 (*)    All other components within normal limits  URINALYSIS, ROUTINE W REFLEX MICROSCOPIC (NOT AT [facility]) - Abnormal; Notable for the following:    Color, Urine AMBER (*)    Bilirubin Urine SMALL (*)    Ketones, ur 15 (*)    Protein, ur 30 (*)    Leukocytes, UA SMALL (*)    All other components within normal limits  URINE MICROSCOPIC-ADD ON - Abnormal; Notable for the following:    Squamous Epithelial / LPF 0-5 (*)    Bacteria, UA FEW (*)    Casts HYALINE CASTS (*)    All other components within normal limits  LIPASE, BLOOD  COMPREHENSIVE METABOLIC PANEL    Imaging Review No results found. I have personally reviewed and evaluated these images and lab results as part of my medical decision-making.   EKG Interpretation None      MDM   Final diagnoses:  None   Symptoms most consistent with a viral gastroenteritis or Crohn's flare. He was given medications and is feeling better. He was given IV steroids and medication for pain and nausea. Due to the degree of his discomfort, he will be admitted for IV steroids and pain medication. I've spoken with the hospitalist who agrees to admit.  I personally performed the services described in this documentation, which was scribed in my presence. The recorded information has been reviewed and is accurate.      Geoffery Lyons, MD 06/27/15 1501

## 2015-06-07 NOTE — H&P (Signed)
History and Physical  Patient Name: Juan Ingram     ZOX:[id number]    DOB: 11/01/1985    DOA: 06/07/2015 Referring physician: Emily Filbert, MD PCP: Farris Has, MD      Chief Complaint: Abdominal pain and diarrhea  HPI: Juan Ingram is a 30 y.o. male with a past medical history significant for Crohn's disease s/p colectomy who presents with abdominal pain and diarrhea for two days.  The patient was in his usual state of health until about a week ago when he had several bowel movements with frank blood in them. Then yesterday morning he woke up with severe, crampy, upper abdominal pain, followed by diarrhea (up to 2 rolls of toilet paper in the last day and a half). He had chills and one episode of vomiting, but otherwise no recurrent vomiting, fever, confusion, further hematochezia, passing out. He's had no recent antibiotics. He restarted Humira in January, but has not been getting his shots regularly. No sick contacts.  In the ED, the patient was tachycardic, but otherwise hemodynamically stable.  Na 134, K 4.0, Cr 2.36 (previous 1.34 and before that 0.6), AG elevated, WBC 15K, lipase normal. UA without hematuria or pyuria, but with granular casts.  A flat radiograph of the abdomen showed no evidence of obstruction, Solu-Medrol and fluids were administered, and TRH were asked to evaluate for admission.     Review of Systems:  All other systems negative except as just noted or noted in the history of present illness.  No Known Allergies  Prior to Admission medications   Medication Sig Start Date End Date Taking? Authorizing Provider  acidophilus (RISAQUAD) CAPS capsule Take 1 capsule by mouth daily.   Yes Historical Provider, MD  Adalimumab (HUMIRA) 40 MG/0.8ML PSKT Inject 40 mg into the skin every 14 (fourteen) days.   Yes Historical Provider, MD  eszopiclone (LUNESTA) 1 MG TABS tablet Take 1 mg by mouth at bedtime as needed for sleep.  03/20/15  Yes Historical Provider, MD    omeprazole (PRILOSEC) 20 MG capsule Take 1 capsule (20 mg total) by mouth daily. Patient taking differently: Take 40 mg by mouth daily.  04/19/15  Yes Ambrose Finland Little, MD  ondansetron (ZOFRAN) 8 MG tablet Take 8 mg by mouth every 8 (eight) hours as needed for nausea.  04/17/15  Yes Historical Provider, MD    Past Medical History  Diagnosis Date  . Crohn's disease (HCC)   . Hypertension     Past Surgical History  Procedure Laterality Date  . Ileostomy placed      March 2012  . Ileostomy reversed      May 2013  . Back surgery      Family history: family history includes Colitis in his maternal grandmother; Ulcerative colitis in his sister.  Social History: Patient works at a bar.  He smokes.         Physical Exam: BP 114/79 mmHg  Pulse 84  Temp(Src) 97.9 F (36.6 C) (Oral)  Resp 20  SpO2 96% General appearance: Well-developed, adult male, alert and in mild distress from pain, malaise.   Eyes: Anicteric, conjunctiva pink, lids and lashes normal.     ENT: No nasal deformity, discharge, or epistaxis.  OP moist without lesions.   Skin: Warm and dry.  Tattoos.  No jaundice.  No suspicious rashes or lesions. Cardiac: Tachycardic, regular, nl S1-S2, no murmurs appreciated.  Capillary refill is brisk.  Radial pulses 2+ and symmetric. Respiratory: Normal respiratory rate and rhythm.  CTAB without rales or wheezes. Abdomen: Abdomen soft without rigidity.  Moderate diffuse TTP, worst in epigastrium, guarding, no rebound. No ascites, distension.   MSK: No deformities or effusions. Neuro: Sensorium intact and responding to questions, attention normal.  Speech is fluent.  Moves all extremities equally and with normal coordination.    Psych: Behavior appropriate.  Affect normal.  No evidence of aural or visual hallucinations or delusions.       Labs on Admission:  The metabolic panel shows Hyponatremia, mild HEENT, AKI. The complete blood count shows leukocytosis. Lipase  normal. Urinalysis shows plan cast without pyuria or hematuria   Radiological Exams on Admission: Personally reviewed: Dg Abd Acute W/chest  06/07/2015  CLINICAL DATA:  Initial evaluation for acute abdominal pain with vomiting for 1 day. History of Crohn's. EXAM: DG ABDOMEN ACUTE W/ 1V CHEST COMPARISON:  Prior study from 04/21/2012. FINDINGS: Cardiac and mediastinal silhouettes are within normal limits. Lungs are normally inflated. No focal infiltrate, pulmonary edema, or pleural effusion. No pneumothorax. Bowel gas pattern within normal limits without evidence for obstruction or ileus. No free air. No soft tissue mass or abnormal calcification. Two surgical clips overlie the left abdomen. Scoliosis with spinal fusion hardware present in the lumbar spine. No acute osseous abnormality. IMPRESSION: 1. Nonobstructive bowel gas pattern with no radiographic evidence for acute intra-abdominal process. 2. No active cardiopulmonary disease. Electronically Signed   By: Rise Mu M.D.   On: 06/07/2015 04:24        Assessment/Plan 1. Abdominal pain and diarrhea:  This is new.  Suspect Crohn's flare. Infectious diarrhea doubted, but will rule out C. Difficile.   -Solu-Medrol 40 mg every 12 -M IVF -Nothing by mouth -C. difficile testing and enteric precautions -Consult to gastroenterology, appreciate cares   2. AKI:  This is new.  He had a creatinine of 0.7 in 2014, and last year 1.34 (presumably that was a minor prerenal azotemia), and presents today with burning 2.36 and mild acidosis. Suspect again prerenal injury. -Check urine lites -Fluids and trend BMP  3. Hyponatremia:  Likely hypovolemic. -Trend BMP      DVT PPx: SCDs Diet: Nothing by mouth Consultants: Gastroenterology Code Status: Full Family Communication: Mother, present at the bedside  Medical decision making: What exists of the patient's previous chart  was reviewed in depth and the case was discussed with Dr.  Tami Ribas. Patient seen 5:41 AM on 06/07/2015.  Disposition Plan:  I recommend admission to medical surgical, inpatient status.  Clinical condition: Stable.  Anticipate steroids and GI consult. Expect several days admission.      Alberteen Sam [facility] Pager [phone]

## 2015-06-07 NOTE — ED Notes (Signed)
Pt. reports generalized abdominal pain with emesis and diarrhea onset 4am yesterday morning , denies fever , mild chills and body aches.

## 2015-06-07 NOTE — Progress Notes (Signed)
Patient ID: Juan Ingram, male   DOB: 08-Sep-1985, 30 y.o.   MRN: [id number] [facility] PROGRESS NOTE  Juan Ingram YTK:[id number] DOB: Mar 13, 1985 DOA: 2015-06-08 PCP: Farris Has, MD  Brief narrative:    Admission after midnight.  30 y.o. male with a past medical history significant for Crohn's disease s/p colectomy who presents with abdominal pain and diarrhea for two days.  In the ED, the patient was tachycardic, otherwise hemodynamically stable.Anion gap was elevated at 20. Creatinine was 2.36, acutely elevated from baseline of 1.34 about one month ago. CT abdomen showed nonobstructive bowel gas pattern. Due to concern for Crohn's flareup patient was seen by GI in consultation.   Assessment/Plan:    Principal Problem:   Exacerbation of Crohn's disease (HCC) - Continue steroids per GI - Continue IV fluids for supportive care - Continue pain management efforts  Active Problems:   Acute renal failure superimposed on chronic kidney disease stage III - Baseline creatinine 1.3 about one month ago and on this admission 2.36 - Likely prerenal secondary to GI losses - Continue IV fluids - Follow-up BMP tomorrow morning   DVT Prophylaxis  - SCD's bilaterally   Code Status: Full.  Family Communication:  plan of care discussed with the patient Disposition Plan: home once cleared by GI   IV access:  Peripheral IV  Procedures and diagnostic studies:    Dg Abd Acute W/chest 06-08-2015  1. Nonobstructive bowel gas pattern with no radiographic evidence for acute intra-abdominal process. 2. No active cardiopulmonary disease.   Medical Consultants:  Dr. Dulce Sellar, GI  Other Consultants:  None   IAnti-Infectives:   None    Manson Passey, MD  [facility] Pager [phone]  Time spent in minutes: 25 minutes  If 7PM-7AM, please contact night-coverage [email or web address] Password [email or web address] 06-08-15, 12:18 PM   LOS: 0 days    HPI/Subjective: No acute  overnight events. Patient reports diarrhea 10 episodes since admission. No blood in stool.   Objective: Filed Vitals:   06-08-2015 0415 2015/06/08 0515 2015/06/08 0547 06/08/2015 0807  BP: 122/83 114/79 120/73 123/76  Pulse: 87 84 77 72  Temp:   98.4 F (36.9 C) 98.4 F (36.9 C)  TempSrc:   Oral Oral  Resp:    18  SpO2: 94% 96% 96% 98%   No intake or output data in the 24 hours ending 2015-06-08 1218  Exam:   General:  Pt is alert, follows commands appropriately, not in acute distress  Cardiovascular: Regular rate and rhythm, S1/S2 appreciated   Respiratory: Clear to auscultation bilaterally, no wheezing, no crackles, no rhonchi  Abdomen: Soft, non tender, non distended, bowel sounds present  Extremities: No edema, pulses palpable bilaterally  Neuro: Grossly nonfocal  Data Reviewed: Basic Metabolic Panel:  Recent Labs Lab 06/08/15 0219  NA 134*  K 4.0  CL 97*  CO2 17*  GLUCOSE 146*  BUN 9  CREATININE 2.36*  CALCIUM 10.1   Liver Function Tests:  Recent Labs Lab 06/08/2015 0219  AST 32  ALT 21  ALKPHOS 90  BILITOT 0.6  PROT 10.0*  ALBUMIN 4.8    Recent Labs Lab 06/08/2015 0219  LIPASE 24   No results for input(s): AMMONIA in the last 168 hours. CBC:  Recent Labs Lab 06/08/2015 0219  WBC 15.1*  HGB 16.7  HCT 49.2  MCV 92.0  PLT 339   Cardiac Enzymes: No results for input(s): CKTOTAL, CKMB, CKMBINDEX, TROPONINI in the last 168 hours. BNP: Invalid input(s): POCBNP CBG: No results for  input(s): GLUCAP in the last 168 hours.  Recent Results (from the past 240 hour(s))  MRSA PCR Screening     Status: None   Collection Time: 06/07/15  7:01 AM  Result Value Ref Range Status   MRSA by PCR NEGATIVE NEGATIVE Final     Scheduled Meds: . acidophilus  1 capsule Oral Daily  . methylPREDNISolone (SOLU-MEDROL) injection  40 mg Intravenous Q12H  . pantoprazole  40 mg Oral Daily  . saccharomyces boulardii  250 mg Oral BID   Continuous Infusions: . sodium  chloride 100 mL/hr at 06/07/15 1040

## 2015-06-07 NOTE — Consult Note (Signed)
[facility] Gastroenterology Consultation Note  Referring Provider: Dr. Joen Laura [facility]) Primary Care Physician:  Farris Has, MD Primary Gastroenterologist:  Dr. Willis Modena  Reason for Consultation:  Nausea, vomiting, diarrhea, abdominal pain  HPI: Juan Ingram is a 30 y.o. male with history of Crohn's disease with prior surgery years ago in [place].  Has been following me for the past few years, and is on adalimumab for maintenance, which he admittedly takes intermittently of late.  He saw me last week and was doing well other than some intermittent hematochezia.  Otherwise was doing well from GI standpoint until yesterday morning.  At that time, he had acute onset of profuse non-bloody diarrhea, followed later by abdominal cramps and multiple episodes of vomiting.  Scant blood in stool.  No recent weight loss or fevers or sick contacts.  Upon arrival to ED, he had acute renal failure, leukocytosis, and his abdominal xray showed no evidence of obstruction.  He is still having diarrhea, several times since admission, and flatus; some mild increase in bloating.  No vomiting since admission.  No weight loss of late.   Past Medical History  Diagnosis Date  . Crohn's disease (HCC)   . Hypertension     Past Surgical History  Procedure Laterality Date  . Ileostomy placed      March 2012  . Ileostomy reversed      May 2013  . Back surgery      Prior to Admission medications   Medication Sig Start Date End Date Taking? Authorizing Provider  acidophilus (RISAQUAD) CAPS capsule Take 1 capsule by mouth daily.   Yes Historical Provider, MD  Adalimumab (HUMIRA) 40 MG/0.8ML PSKT Inject 40 mg into the skin every 14 (fourteen) days.   Yes Historical Provider, MD  eszopiclone (LUNESTA) 1 MG TABS tablet Take 1 mg by mouth at bedtime as needed for sleep.  03/20/15  Yes Historical Provider, MD  omeprazole (PRILOSEC) 20 MG capsule Take 1 capsule (20 mg total) by mouth daily. Patient taking  differently: Take 40 mg by mouth daily.  04/19/15  Yes Ambrose Finland Little, MD  ondansetron (ZOFRAN) 8 MG tablet Take 8 mg by mouth every 8 (eight) hours as needed for nausea.  04/17/15  Yes Historical Provider, MD    Current Facility-Administered Medications  Medication Dose Route Frequency Provider Last Rate Last Dose  . 0.9 %  sodium chloride infusion   Intravenous Continuous Alison Murray, MD      . acetaminophen (TYLENOL) tablet 650 mg  650 mg Oral Q6H PRN Alberteen Sam, MD       Or  . acetaminophen (TYLENOL) suppository 650 mg  650 mg Rectal Q6H PRN Alberteen Sam, MD      . acidophilus (RISAQUAD) capsule 1 capsule  1 capsule Oral Daily Alberteen Sam, MD      . HYDROmorphone (DILAUDID) injection 1 mg  1 mg Intravenous Q4H PRN Alberteen Sam, MD   1 mg at 06/07/15 1610  . methylPREDNISolone sodium succinate (SOLU-MEDROL) 40 mg/mL injection 40 mg  40 mg Intravenous Q12H Alberteen Sam, MD      . ondansetron (ZOFRAN) tablet 4 mg  4 mg Oral Q6H PRN Alberteen Sam, MD       Or  . ondansetron (ZOFRAN) injection 4 mg  4 mg Intravenous Q6H PRN Alberteen Sam, MD   4 mg at 06/07/15 0814  . pantoprazole (PROTONIX) EC tablet 40 mg  40 mg Oral Daily [employer],  MD      . zolpidem (AMBIEN) tablet 5 mg  5 mg Oral QHS PRN,MR X 1 Alberteen Sam, MD        Allergies as of 06/07/2015  . (No Known Allergies)    Family History  Problem Relation Age of Onset  . Ulcerative colitis Sister   . Colitis Maternal Grandmother     Social History   Social History  . Marital Status: Single    Spouse Name: N/A  . Number of Children: N/A  . Years of Education: N/A   Occupational History  . Not on file.   Social History Main Topics  . Smoking status: Current Every Day Smoker  . Smokeless tobacco: Not on file  . Alcohol Use: Yes  . Drug Use: Yes    Special: Marijuana  . Sexual Activity: Not on file   Other Topics Concern  . Not  on file   Social History Narrative    Review of Systems: Positive = bold Gen: Denies any fever, chills, rigors, night sweats, anorexia, fatigue, weakness, malaise, involuntary weight loss, and sleep disorder CV: Denies chest pain, angina, palpitations, syncope, orthopnea, PND, peripheral edema, and claudication. Resp: Denies dyspnea, cough, sputum, wheezing, coughing up blood. GI: Described in detail in HPI.    GU : Denies urinary burning, blood in urine, urinary frequency, urinary hesitancy, nocturnal urination, and urinary incontinence. MS: Denies joint pain or swelling.  Denies muscle weakness, cramps, atrophy.  Derm: Denies rash, itching, oral ulcerations, hives, unhealing ulcers.  Psych: Denies depression, anxiety, memory loss, suicidal ideation, hallucinations,  and confusion. Heme: Denies bruising, bleeding, and enlarged lymph nodes. Neuro:  Denies any headaches, dizziness, paresthesias. Endo:  Denies any problems with DM, thyroid, adrenal function.  Physical Exam: Vital signs in last 24 hours: Temp:  [97.9 F (36.6 C)-98.4 F (36.9 C)] 98.4 F (36.9 C) (03/31 0807) Pulse Rate:  [72-119] 72 (03/31 0807) Resp:  [18-20] 18 (03/31 0807) BP: (100-124)/(73-88) 123/76 mmHg (03/31 0807) SpO2:  [94 %-99 %] 98 % (03/31 0807)   General:   Alert,  Well-developed, well-nourished, pleasant and cooperative in NAD Head:  Normocephalic and atraumatic. Eyes:  Sclera clear, no icterus.   Conjunctiva pink. Ears:  Normal auditory acuity. Nose:  No deformity, discharge,  or lesions. Mouth:  No deformity or lesions.  Oropharynx pink but very dry mucous membranes Neck:  Supple; no masses or thyromegaly. Lungs:  Clear throughout to auscultation.   No wheezes, crackles, or rhonchi. No acute distress. Heart:  Regular rate and rhythm; no murmurs, clicks, rubs,  or gallops. Abdomen:  Soft, mild generalized tenderness, old surgical scars noted; No masses, hepatosplenomegaly or hernias noted.  Hyperactive bowel sounds, without guarding, and without rebound.     Msk:  Symmetrical without gross deformities. Normal posture. Pulses:  Normal pulses noted. Extremities:  Without clubbing or edema. Neurologic:  Alert and  oriented x4; diffusely weak, otherwise grossly normal neurologically. Skin:  Multiple tattoos, otherwise intact without significant lesions or rashes. Cervical Nodes:  No significant cervical adenopathy. Psych:  Alert and cooperative. Normal mood and affect.   Lab Results:  Recent Labs  06/07/15 0219  WBC 15.1*  HGB 16.7  HCT 49.2  PLT 339   BMET  Recent Labs  06/07/15 0219  NA 134*  K 4.0  CL 97*  CO2 17*  GLUCOSE 146*  BUN 9  CREATININE 2.36*  CALCIUM 10.1   LFT  Recent Labs  06/07/15 0219  PROT 10.0*  ALBUMIN 4.8  AST 32  ALT 21  ALKPHOS 90  BILITOT 0.6   PT/INR No results for input(s): LABPROT, INR in the last 72 hours.  Studies/Results: Dg Abd Acute W/chest  06/07/2015  CLINICAL DATA:  Initial evaluation for acute abdominal pain with vomiting for 1 day. History of Crohn's. EXAM: DG ABDOMEN ACUTE W/ 1V CHEST COMPARISON:  Prior study from 04/21/2012. FINDINGS: Cardiac and mediastinal silhouettes are within normal limits. Lungs are normally inflated. No focal infiltrate, pulmonary edema, or pleural effusion. No pneumothorax. Bowel gas pattern within normal limits without evidence for obstruction or ileus. No free air. No soft tissue mass or abnormal calcification. Two surgical clips overlie the left abdomen. Scoliosis with spinal fusion hardware present in the lumbar spine. No acute osseous abnormality. IMPRESSION: 1. Nonobstructive bowel gas pattern with no radiographic evidence for acute intra-abdominal process. 2. No active cardiopulmonary disease. Electronically Signed   By: Rise Mu M.D.   On: 06/07/2015 04:24   Impression:  1.  Nausea and vomiting, acute onset. 2.  Diarrhea, acute onset. 3.  Acute renal failure, likely  dehydration-mediated. 4.  History Crohn's disease, on adalimumab. 5.  Overall constellation of acute symptoms most consistent with acute gastroenteritis.  Plan:  1.  Patient is significantly dehydrated; he needs at least 500 cc NS bolus, and aggressive hourly repletion (100 cc/hour) for the next several hours. 2.  Clear liquid diet is ok.  If he can't tolerate this, might consider CT scan abdomen/pelvis for evaluation in the near future, but with his clinical symptoms not supportive of obstruction (and xray yesterday also not supportive of obstruction) and his elevated Creatinine, he would not be a candidate for contrast administration. 3.  Would hold off on IV steroids, as his symptoms are not consistent with Crohn's flare. 4.  Florastor 250 mg po bid, pending stool studies. 5.  C. Diff gene NAA and GI stool pathogen panel. 6.  [facility] will follow; thank you for the consultation.   LOS: 0 days   Reham Slabaugh M  06/07/2015, 9:48 AM  Pager [phone] If no answer or after 5 PM call [phone]

## 2015-06-08 DIAGNOSIS — D72829 Elevated white blood cell count, unspecified: Secondary | ICD-10-CM

## 2015-06-08 LAB — COMPREHENSIVE METABOLIC PANEL
ALBUMIN: 3.4 g/dL — AB (ref 3.5–5.0)
ALT: 15 U/L — ABNORMAL LOW (ref 17–63)
ANION GAP: 10 (ref 5–15)
AST: 24 U/L (ref 15–41)
Alkaline Phosphatase: 56 U/L (ref 38–126)
BILIRUBIN TOTAL: 0.5 mg/dL (ref 0.3–1.2)
BUN: 13 mg/dL (ref 6–20)
CO2: 20 mmol/L — ABNORMAL LOW (ref 22–32)
Calcium: 8.4 mg/dL — ABNORMAL LOW (ref 8.9–10.3)
Chloride: 105 mmol/L (ref 101–111)
Creatinine, Ser: 0.89 mg/dL (ref 0.61–1.24)
GFR calc Af Amer: >60 mL/min (ref >60)
GLUCOSE: 113 mg/dL — AB (ref 65–99)
POTASSIUM: 4.5 mmol/L (ref 3.5–5.1)
Sodium: 135 mmol/L (ref 135–145)
TOTAL PROTEIN: 7.3 g/dL (ref 6.5–8.1)

## 2015-06-08 LAB — CBC
HEMATOCRIT: 39.8 % (ref 39.0–52.0)
HEMOGLOBIN: 13.5 g/dL (ref 13.0–17.0)
MCH: 31.6 pg (ref 26.0–34.0)
MCHC: 33.9 g/dL (ref 30.0–36.0)
MCV: 93.2 fL (ref 78.0–100.0)
Platelets: 283 10*3/uL (ref 150–400)
RBC: 4.27 MIL/uL (ref 4.22–5.81)
RDW: 12.5 % (ref 11.5–15.5)
WBC: 12.3 10*3/uL — AB (ref 4.0–10.5)

## 2015-06-08 NOTE — Progress Notes (Signed)
Patient ID: JANSSEN PULS, male   DOB: 1985/10/23, 30 y.o.   MRN: [id number] [facility] PROGRESS NOTE  Cobra Depass Cavanaugh LXB:[id number] DOB: 1985-10-11 DOA: 06-16-2015 PCP: Farris Has, MD  Brief narrative:    30 y.o. male with a past medical history significant for Crohn's disease s/p colectomy who presents with abdominal pain and diarrhea for two days.  In the ED, the patient was tachycardic, otherwise hemodynamically stable.Anion gap was elevated at 20. Creatinine was 2.36, acutely elevated from baseline of 1.34 about one month ago. CT abdomen showed nonobstructive bowel gas pattern. Due to concern for Crohn's flareup patient was seen by GI in consultation.  Assessment/Plan:    Principal Problem:   Exacerbation of Crohn's disease (HCC) - Continue steroids per GI - Continue pain management efforts  Active Problems:   Rotavirus infection / Leukocytosis  - GI pathogen panel positive for rotavirus - Manage per GI - Supportive care with IV fluids  - Diarrhea improving  - Continue acidophilus     Acute renal failure superimposed on chronic kidney disease stage III - Baseline creatinine 1.3 about one month ago and on this admission 2.36 - Likely prerenal secondary to GI losses - Cr normalized with IV fluids   DVT Prophylaxis  - SCD's bilaterally in hospital   Code Status: Full.  Family Communication:  plan of care discussed with the patient Disposition Plan: home once cleared by GI   IV access:  Peripheral IV  Procedures and diagnostic studies:    Dg Abd Acute W/chest 06/16/2015  1. Nonobstructive bowel gas pattern with no radiographic evidence for acute intra-abdominal process. 2. No active cardiopulmonary disease.   Medical Consultants:  Dr. Dulce Sellar, GI  Other Consultants:  None   IAnti-Infectives:   None    Manson Passey, MD  [facility] Pager [phone]  Time spent in minutes: 25 minutes  If 7PM-7AM, please contact  night-coverage [email or web address] Password [email or web address] 06/08/2015, 11:26 AM   LOS: 1 day    HPI/Subjective: No acute overnight events. Patient reports diarrhea 10 episodes since admission. No blood in stool.   Objective: Filed Vitals:   2015-06-16 1705 June 16, 2015 2001 06/08/15 0433 06/08/15 0749  BP: 125/78 123/78 97/55 115/74  Pulse: 71 57 54 62  Temp: 98 F (36.7 C) 97.5 F (36.4 C) 98.5 F (36.9 C) 97.9 F (36.6 C)  TempSrc: Oral Oral Oral Oral  Resp: 16 19 20 18   SpO2: 98% 98% 98% 100%    Intake/Output Summary (Last 24 hours) at 06/08/15 1126 Last data filed at 06/08/15 1000  Gross per 24 hour  Intake 2556.67 ml  Output      0 ml  Net 2556.67 ml    Exam:   General:  Pt is alert, not in acute distress  Cardiovascular: RRR, S1/S2 (+)  Respiratory: No wheezing, no crackles, no rhonchi  Abdomen: (+) BS, non tender   Extremities: No swelling, palpable pulses   Neuro: Nonfocal  Data Reviewed: Basic Metabolic Panel:  Recent Labs Lab 16-Jun-2015 0219 06/08/15 0459  NA 134* 135  K 4.0 4.5  CL 97* 105  CO2 17* 20*  GLUCOSE 146* 113*  BUN 9 13  CREATININE 2.36* 0.89  CALCIUM 10.1 8.4*   Liver Function Tests:  Recent Labs Lab 06-16-2015 0219 06/08/15 0459  AST 32 24  ALT 21 15*  ALKPHOS 90 56  BILITOT 0.6 0.5  PROT 10.0* 7.3  ALBUMIN 4.8 3.4*    Recent Labs Lab 2015-06-16 0219  LIPASE 24  No results for input(s): AMMONIA in the last 168 hours. CBC:  Recent Labs Lab 06/07/15 0219 06/08/15 0459  WBC 15.1* 12.3*  HGB 16.7 13.5  HCT 49.2 39.8  MCV 92.0 93.2  PLT 339 283   Cardiac Enzymes: No results for input(s): CKTOTAL, CKMB, CKMBINDEX, TROPONINI in the last 168 hours. BNP: Invalid input(s): POCBNP CBG:  Recent Labs Lab 06/07/15 2000  GLUCAP 142*    Recent Results (from the past 240 hour(s))  MRSA PCR Screening     Status: None   Collection Time: 06/07/15  7:01 AM  Result Value Ref Range Status   MRSA by PCR NEGATIVE NEGATIVE Final      Scheduled Meds: . acidophilus  1 capsule Oral Daily  . methylPREDNISolone (SOLU-MEDROL) injection  40 mg Intravenous Q12H  . pantoprazole  40 mg Oral Daily  . saccharomyces boulardii  250 mg Oral BID   Continuous Infusions: . sodium chloride 100 mL/hr at 06/08/15 0443

## 2015-06-08 NOTE — Progress Notes (Signed)
Subjective: Less abdominal pain. Still with some loose stools.  Objective: Vital signs in last 24 hours: Temp:  [97.5 F (36.4 C)-98.5 F (36.9 C)] 98.4 F (36.9 C) (04/01 1648) Pulse Rate:  [54-71] 56 (04/01 1648) Resp:  [16-20] 17 (04/01 1648) BP: (97-125)/(55-78) 107/64 mmHg (04/01 1648) SpO2:  [97 %-100 %] 97 % (04/01 1648) Weight change:  Last BM Date: 06/07/15  PE: GEN:  NAD, less dehydrated-appearing ABD:  Soft, mild distended, mild generalized tenderness, active bowel sounds  Lab Results: CBC    Component Value Date/Time   WBC 12.3* 06/08/2015 0459   RBC 4.27 06/08/2015 0459   HGB 13.5 06/08/2015 0459   HCT 39.8 06/08/2015 0459   PLT 283 06/08/2015 0459   MCV 93.2 06/08/2015 0459   MCH 31.6 06/08/2015 0459   MCHC 33.9 06/08/2015 0459   RDW 12.5 06/08/2015 0459   LYMPHSABS 2.0 04/21/2012 1116   MONOABS 1.4* 04/21/2012 1116   EOSABS 0.1 04/21/2012 1116   BASOSABS 0.1 04/21/2012 1116   CMP     Component Value Date/Time   NA 135 06/08/2015 0459   K 4.5 06/08/2015 0459   CL 105 06/08/2015 0459   CO2 20* 06/08/2015 0459   GLUCOSE 113* 06/08/2015 0459   BUN 13 06/08/2015 0459   CREATININE 0.89 06/08/2015 0459   CALCIUM 8.4* 06/08/2015 0459   PROT 7.3 06/08/2015 0459   ALBUMIN 3.4* 06/08/2015 0459   AST 24 06/08/2015 0459   ALT 15* 06/08/2015 0459   ALKPHOS 56 06/08/2015 0459   BILITOT 0.5 06/08/2015 0459   GFRNONAA >60 06/08/2015 0459   GFRAA >60 06/08/2015 0459   Stool studies + rotavirus   Assessment:  1.  Acute onset nausea, vomiting, diarrhea.  Stool studies + rotavirus.  Slowly improving. 2.  Acute renal failure, resolved after rehydration.  Plan:  1.  Advance diet to full liquids. 2.  Supportive care for nausea, vomiting, abdominal discomfort. 3.  If patient can tolerate soft diet by tomorrow, and has adequate pain and nausea control on oral agents, might consider discharge home tomorrow. 4.  [facility] will follow.   Freddy Jaksch 06/08/2015, 4:52 PM   Pager [phone] If no answer or after 5 PM call [phone]

## 2015-06-09 DIAGNOSIS — E860 Dehydration: Secondary | ICD-10-CM

## 2015-06-09 NOTE — Progress Notes (Signed)
Patient ID: Juan Ingram, male   DOB: [birth year], 30 y.o.   MRN: [id number] [facility] PROGRESS NOTE  Juan Ingram QIW:[id number] DOB: 1985/10/15 DOA: 06/24/2015 PCP: London Pepper, MD  Brief narrative:    30 y.o. male with a past medical history significant for Crohn's disease s/p colectomy who presents with abdominal pain and diarrhea for two days.  In the ED, the patient was tachycardic, otherwise hemodynamically stable.Anion gap was elevated at 20. Creatinine was 2.36, acutely elevated from baseline of 1.34 about one month ago. CT abdomen showed nonobstructive bowel gas pattern. Due to concern for Crohn's flareup patient was seen by GI in consultation.  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  Family Communication:  plan of care discussed with the patient Disposition Plan: home once cleared by GI; advancing diet slowly. No steroids as per GI rec's  IV access:  Peripheral IV  Procedures and diagnostic studies:    Dg Abd Acute W/chest 06-24-15  1. Nonobstructive bowel gas pattern with no radiographic evidence for acute  intra-abdominal process. 2. No active cardiopulmonary disease.   Medical Consultants:  Dr. Paulita Fujita, GI  Other Consultants:  None   IAnti-Infectives:   None    [name], MD  [facility] Pager [phone]  Time spent in minutes: 25 minutes  If 7PM-7AM, please contact night-coverage [email or web address] Password [facility] 06/09/2015, 9:49 PM   LOS: 2 days    HPI/Subjective: Patient with worsening abd pain overnight and more than 7 watery episodes of diarrhea. No fever, no nausea, no vomiting.  Objective: Filed Vitals:   06/08/15 1648 06/08/15 2047 06/09/15 0556 06/09/15 2100  BP: 107/64 104/71 105/57 107/52  Pulse: 56 53 55 62  Temp: 98.4 F (36.9 C) 98 F (36.7 C) 98.1 F (36.7 C) 98 F (36.7 C)  TempSrc: Oral Oral Oral Oral  Resp: _0 SpO2: 97% 97% 97% 98%    Intake/Output Summary (Last 24 hours) at 06/09/15 2149 Last data filed at 06/09/15 1320  Gross per 24 hour  Intake    990 ml  Output      0 ml  Net    990 ml    Exam:   General:  Pt is alert, not in acute distress currently and afebrile. Reports ongoing abd pain and diarrhea after advancing diet last night. No N/V  Cardiovascular: RRR, S1/S2 (+)  Respiratory: No wheezing, no crackles, no rhonchi  Abdomen: (+) BS, non tender on palpation, mild distension   Extremities: No swelling, palpable pulses   Neuro: Nonfocal  Data Reviewed: Basic Metabolic Panel:  Recent Labs Lab Jun 24, 2015 0219  06/08/15 0459  NA 134* 135  K 4.0 4.5  CL 97* 105  CO2 17* 20*  GLUCOSE 146* 113*  BUN 9 13  CREATININE 2.36* 0.89  CALCIUM 10.1 8.4*   Liver Function Tests:  Recent Labs Lab 06/07/15 0219 06/08/15 0459  AST 32 24  ALT 21 15*  ALKPHOS 90 56  BILITOT 0.6 0.5  PROT 10.0* 7.3  ALBUMIN 4.8 3.4*    Recent Labs Lab 06/07/15 0219  LIPASE 24   CBC:  Recent Labs Lab 06/07/15 0219 06/08/15 0459  WBC 15.1* 12.3*  HGB 16.7 13.5  HCT 49.2 39.8  MCV 92.0 93.2  PLT 339 283    CBG:  Recent Labs Lab 06/07/15 2000  GLUCAP 142*    Recent Results (from the past 240 hour(s))  MRSA PCR Screening     Status: None   Collection Time: 06/07/15  7:01 AM  Result Value Ref Range Status   MRSA by PCR NEGATIVE NEGATIVE Final     Scheduled Meds: . pantoprazole  40 mg Oral Daily  . saccharomyces boulardii  250 mg Oral BID   Continuous Infusions: . sodium chloride 100 mL/hr at 06/09/15 2136

## 2015-06-09 NOTE — Progress Notes (Signed)
Subjective: Recurrent diarrhea overnight. Worsening abdominal pain overnight.  Objective: Vital signs in last 24 hours: Temp:  [98 F (36.7 C)-98.4 F (36.9 C)] 98.1 F (36.7 C) (04/02 0556) Pulse Rate:  [53-56] 55 (04/02 0556) Resp:  [17-18] 18 (04/02 0556) BP: (104-107)/(57-71) 105/57 mmHg (04/02 0556) SpO2:  [97 %] 97 % (04/02 0556) Weight change:  Last BM Date: 06/09/15  PE: GEN:  NAD, not dehydrated-appearing ABD:  Soft, old surgical scars, mild generalized tenderness  Lab Results: CBC    Component Value Date/Time   WBC 12.3* 06/08/2015 0459   RBC 4.27 06/08/2015 0459   HGB 13.5 06/08/2015 0459   HCT 39.8 06/08/2015 0459   PLT 283 06/08/2015 0459   MCV 93.2 06/08/2015 0459   MCH 31.6 06/08/2015 0459   MCHC 33.9 06/08/2015 0459   RDW 12.5 06/08/2015 0459   LYMPHSABS 2.0 04/21/2012 1116   MONOABS 1.4* 04/21/2012 1116   EOSABS 0.1 04/21/2012 1116   BASOSABS 0.1 04/21/2012 1116   CMP     Component Value Date/Time   NA 135 06/08/2015 0459   K 4.5 06/08/2015 0459   CL 105 06/08/2015 0459   CO2 20* 06/08/2015 0459   GLUCOSE 113* 06/08/2015 0459   BUN 13 06/08/2015 0459   CREATININE 0.89 06/08/2015 0459   CALCIUM 8.4* 06/08/2015 0459   PROT 7.3 06/08/2015 0459   ALBUMIN 3.4* 06/08/2015 0459   AST 24 06/08/2015 0459   ALT 15* 06/08/2015 0459   ALKPHOS 56 06/08/2015 0459   BILITOT 0.5 06/08/2015 0459   GFRNONAA >60 06/08/2015 0459   GFRAA >60 06/08/2015 0459   Assessment:  1.  Rotavirus infection, slowly improving but with little flare up last night, possibly from too much lactose products. 2.  Crohn's disease.  Do not suspect active blare.  Plan:  1.  IV fluids. 2.  Full liquid diet, but low lactose. 3.  Judicious analgesics, try to transition to oral analgesics. 4.  Hopefully home 1-2 days. 5.  [facility] will follow.   Freddy Jaksch 06/09/2015, 9:48 AM   Pager [phone] If no answer or after 5 PM call [phone]

## 2015-06-10 DIAGNOSIS — E876 Hypokalemia: Secondary | ICD-10-CM

## 2015-06-10 DIAGNOSIS — K50919 Crohn's disease, unspecified, with unspecified complications: Secondary | ICD-10-CM

## 2015-06-10 LAB — BASIC METABOLIC PANEL
Anion gap: 10 (ref 5–15)
BUN: 6 mg/dL (ref 6–20)
CHLORIDE: 107 mmol/L (ref 101–111)
CO2: 22 mmol/L (ref 22–32)
CREATININE: 0.69 mg/dL (ref 0.61–1.24)
Calcium: 8.4 mg/dL — ABNORMAL LOW (ref 8.9–10.3)
GFR calc Af Amer: >60 mL/min (ref >60)
GLUCOSE: 88 mg/dL (ref 65–99)
POTASSIUM: 3.3 mmol/L — AB (ref 3.5–5.1)
SODIUM: 139 mmol/L (ref 135–145)

## 2015-06-10 LAB — SEDIMENTATION RATE: Sed Rate: 20 mm/hr — ABNORMAL HIGH (ref 0–16)

## 2015-06-10 LAB — MAGNESIUM: MAGNESIUM: 1.9 mg/dL (ref 1.7–2.4)

## 2015-06-10 MED ORDER — POTASSIUM CHLORIDE 10 MEQ/100ML IV SOLN
10.0000 meq | INTRAVENOUS | Status: AC
  Administered 2015-06-10 (×3): 10 meq via INTRAVENOUS
  Filled 2015-06-10 (×3): qty 100

## 2015-06-10 MED ORDER — LOPERAMIDE HCL 2 MG PO CAPS
2.0000 mg | ORAL_CAPSULE | Freq: Three times a day (TID) | ORAL | Status: DC | PRN
  Administered 2015-06-11 – 2015-06-12 (×4): 2 mg via ORAL
  Filled 2015-06-10 (×4): qty 1

## 2015-06-10 NOTE — Progress Notes (Signed)
[facility] Progress Note  Subjective: Patient with a history of Crohn's disease. About 5 days ago had abrupt onset of diarrhea. Stool study positive for rotavirus. He is still experiencing diarrhea.  Objective: Vital signs in last 24 hours: Temp:  [98 F (36.7 C)-98.1 F (36.7 C)] 98.1 F (36.7 C) (04/03 0545) Pulse Rate:  [62-64] 64 (04/03 0545) Resp:  [18] 18 (04/03 0545) BP: (103-107)/(52-58) 103/58 mmHg (04/03 0545) SpO2:  [97 %-98 %] 97 % (04/03 0545) Weight change:    PE:  Alert and oriented  No distress  Heart regular rhythm  Lungs clear  Abdomen: Bowel sounds normal, soft, nontender  Lab Results: No results found for this or any previous visit (from the past 24 hour(s)).  Studies/Results: No results found.    Assessment: #1. History of Crohn's disease  #2. Rotavirus infection  Plan:   Continue supportive care with IV fluids. I think we could try adding some Imodium.    SAM F Alima Naser 06/10/2015, 10:01 AM  Pager: [phone] If no answer or after 5 PM call [phone]

## 2015-06-10 NOTE — Progress Notes (Signed)
Patient's GI panel was positive for Rotavirus. Verified with Infection Prevention that patient must remain on enteric precautions for entire length of stay.  Leanna Battles, RN.

## 2015-06-10 NOTE — Progress Notes (Signed)
Patient ID: Juan Ingram, male   DOB: 12/04/1985, 30 y.o.   MRN: [id number] [facility] PROGRESS NOTE  Rafal Archuleta Chio [id number]:[id number] DOB: [birth year] DOA: 28-Jun-2015 PCP: Farris Has, MD  Brief narrative:    30 y.o. male with a past medical history significant for Crohn's disease s/p colectomy who presents with abdominal pain and diarrhea for two days.  In the ED, the patient was tachycardic, otherwise hemodynamically stable.Anion gap was elevated at 20. Creatinine was 2.36, acutely elevated from baseline of 1.34 about one month ago. CT abdomen showed nonobstructive bowel gas pattern. Due to concern for Crohn's flareup patient was seen by GI in consultation.  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  Family Communication:  plan of care discussed with the patient Disposition Plan: home once cleared by GI  IV access:  Peripheral IV  Procedures and diagnostic studies:    Dg Abd Acute W/chest June 28, 2015  1. Nonobstructive bowel gas pattern with no radiographic evidence for acute intra-abdominal process. 2. No active cardiopulmonary disease.  Medical Consultants:  Dr. Dulce Sellar, GI  Other Consultants:  None   IAnti-Infectives:    None    Auburn Hester, MD  [facility] Pager [phone]  Time spent in minutes: 25 minutes  If 7PM-7AM, please contact night-coverage [email or web address] Password [email or web address] 06/10/2015, 12:00 PM   LOS: 3 days    HPI/Subjective: Patient Reports 10-14 episodes of diarrhea since yesterday.  Objective: Filed Vitals:   06/09/15 0556 06/09/15 2100 06/10/15 0545 06/10/15 1100  BP: 105/57 107/52 103/58 105/59  Pulse: 55 62 64 56  Temp: 98.1 F (36.7 C) 98 F (36.7 C) 98.1 F (36.7 C) 98.1 F (36.7 C)  TempSrc: Oral Oral Oral Oral  Resp: SpO2: 97% 98% 97% 97%    Intake/Output Summary (Last 24 hours) at 06/10/15 1200 Last data filed at 06/10/15 0200  Gross per 24 hour  Intake   2150 ml  Output      0 ml  Net   2150 ml    Exam:   General:  Pt is alert, no distress  Cardiovascular: Rate controlled   Respiratory: No wheezing, no crackles  Abdomen: (+) BS, non tender   Extremities: No edema, palpable pulses   Neuro: No focal deficits   Data Reviewed: Basic Metabolic Panel:  Recent Labs Lab 06-28-15 0219 06/08/15 0459 06/10/15 0845  NA 134* 135 139  K 4.0 4.5 3.3*  CL 97* 105 107  CO2 17* 20* 22  GLUCOSE 146* 113* 88  BUN CREATININE 2.36* 0.89 0.69  CALCIUM 10.1 8.4* 8.4*  MG  --   --  1.9   Liver Function Tests:  Recent Labs  Lab 06/07/15 0219 06/08/15 0459  AST 32 24  ALT 21 15*  ALKPHOS 90 56  BILITOT 0.6 0.5  PROT 10.0* 7.3  ALBUMIN 4.8 3.4*    Recent Labs Lab 06/07/15 0219  LIPASE 24   CBC:  Recent Labs Lab 06/07/15 0219 06/08/15 0459  WBC 15.1* 12.3*  HGB 16.7 13.5  HCT 49.2 39.8  MCV 92.0 93.2  PLT 339 283   CBG:  Recent Labs Lab 06/07/15 2000  GLUCAP 142*    Recent Results (from the past 240 hour(s))  MRSA PCR Screening     Status: None   Collection Time: 06/07/15  7:01 AM  Result Value Ref Range Status   MRSA by PCR NEGATIVE NEGATIVE Final     Scheduled Meds: . saccharomyces boulardii  250  mg Oral BID   Continuous Infusions: . sodium chloride 100 mL/hr at 06/09/15 2136

## 2015-06-11 DIAGNOSIS — A084 Viral intestinal infection, unspecified: Secondary | ICD-10-CM

## 2015-06-11 NOTE — Progress Notes (Signed)
Dong better. Some form noted to stool. Suspect All his symptoms due to Roto virus. Continue supportive care. Home soon.

## 2015-06-11 NOTE — Progress Notes (Signed)
Patient ID: Juan Ingram, male   DOB: 02/12/1986, 30 y.o.   MRN: [id number] [facility] PROGRESS NOTE  Juan Ingram BJY:[id number] DOB: 20-Nov-1985 DOA: 2015-06-11 PCP: Farris Has, MD  Brief narrative:    30 y.o. male with a past medical history significant for Crohn's disease s/p colectomy who presents with abdominal pain and diarrhea for two days.  In the ED, the patient was tachycardic, otherwise hemodynamically stable.Anion gap was elevated at 20. Creatinine was 2.36, acutely elevated from baseline of 1.34 about one month ago. CT abdomen showed nonobstructive bowel gas pattern. Due to concern for Crohn's flareup patient was seen by GI in consultation.  Subsequently he was found to have Rotavirus infection.  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  Family Communication:  plan of care discussed with the patient Disposition Plan: home once cleared by GI  IV access:  Peripheral IV  Procedures and diagnostic studies:    Dg Abd Acute W/chest 2015-06-11  1. Nonobstructive bowel gas pattern with no radiographic evidence for acute intra-abdominal process. 2. No active cardiopulmonary disease.  Medical Consultants:  Dr. Dulce Sellar, GI  Other Consultants:  None   IAnti-Infectives:   None    Taiylor Virden, MD  [facility] Pager [phone]  Time spent in minutes: 15 minutes  If 7PM-7AM, please contact night-coverage [email or web address] Password [facility] 06/11/2015, 11:46 AM   LOS: 4 days     HPI/Subjective: Pt said he had more than 15 episodes of diarrhea in past 24 hours.   Objective: Filed Vitals:   06/10/15 1955 06/11/15 0436 06/11/15 0812 06/11/15 0904  BP: 115/85 104/49 88/43 109/77  Pulse: 61 60 58 66  Temp: 98.5 F (36.9 C) 98.1 F (36.7 C) 98.1 F (36.7 C)   TempSrc: Oral Oral Oral   Resp: 18 19 18    SpO2: 99% 98% 98%     Intake/Output Summary (Last 24 hours) at 06/11/15 1146 Last data filed at 06/11/15 1022  Gross per 24 hour  Intake   3820 ml  Output      0 ml  Net   3820 ml    Exam:   General:  Pt is not in distress  Cardiovascular: Rate controlled, appreciate S1, S2   Respiratory: no rhonchi, no wheezing   Abdomen: non tender, non distended   Extremities: No swelling, palpable pulses   Neuro: Nonfocal   Data Reviewed: Basic Metabolic Panel:  Recent Labs Lab 06/11/2015 0219 06/08/15 0459 06/10/15 0845  NA 134* 135 139  K 4.0 4.5 3.3*  CL 97* 105 107  CO2 17* 20* 22  GLUCOSE 146* 113* 88  BUN 9 13 6   CREATININE 2.36* 0.89 0.69  CALCIUM 10.1 8.4* 8.4*  MG  --   --  1.9   Liver Function Tests:  Recent Labs Lab 06/11/15 0219 06/08/15 0459  AST 32 24  ALT 21 15*  ALKPHOS 90 56  BILITOT 0.6 0.5  PROT 10.0* 7.3  ALBUMIN 4.8 3.4*  Recent Labs Lab 06/07/15 0219  LIPASE 24   CBC:  Recent Labs Lab 06/07/15 0219 06/08/15 0459  WBC 15.1* 12.3*  HGB 16.7 13.5  HCT 49.2 39.8  MCV 92.0 93.2  PLT 339 283   CBG:  Recent Labs Lab 06/07/15 2000  GLUCAP 142*    Recent Results (from the past 240 hour(s))  MRSA PCR Screening     Status: None   Collection Time: 06/07/15  7:01 AM  Result Value Ref Range Status   MRSA by PCR NEGATIVE NEGATIVE Final     Scheduled Meds: . saccharomyces boulardii  250 mg Oral BID   Continuous Infusions: . sodium chloride 100 mL/hr at 06/11/15 0645

## 2015-06-12 DIAGNOSIS — N183 Chronic kidney disease, stage 3 (moderate): Secondary | ICD-10-CM

## 2015-06-12 DIAGNOSIS — K50119 Crohn's disease of large intestine with unspecified complications: Secondary | ICD-10-CM

## 2015-06-12 DIAGNOSIS — A080 Rotaviral enteritis: Secondary | ICD-10-CM

## 2015-06-12 DIAGNOSIS — N179 Acute kidney failure, unspecified: Secondary | ICD-10-CM

## 2015-06-12 LAB — BASIC METABOLIC PANEL
ANION GAP: 9 (ref 5–15)
BUN: <5 mg/dL — ABNORMAL LOW (ref 6–20)
CO2: 25 mmol/L (ref 22–32)
Calcium: 8.4 mg/dL — ABNORMAL LOW (ref 8.9–10.3)
Chloride: 106 mmol/L (ref 101–111)
Creatinine, Ser: 0.7 mg/dL (ref 0.61–1.24)
GFR calc Af Amer: >60 mL/min (ref >60)
GLUCOSE: 89 mg/dL (ref 65–99)
POTASSIUM: 3.2 mmol/L — AB (ref 3.5–5.1)
SODIUM: 140 mmol/L (ref 135–145)

## 2015-06-12 LAB — CBC
HEMATOCRIT: 39 % (ref 39.0–52.0)
HEMOGLOBIN: 12.7 g/dL — AB (ref 13.0–17.0)
MCH: 30.3 pg (ref 26.0–34.0)
MCHC: 32.6 g/dL (ref 30.0–36.0)
MCV: 93.1 fL (ref 78.0–100.0)
Platelets: 276 10*3/uL (ref 150–400)
RBC: 4.19 MIL/uL — AB (ref 4.22–5.81)
RDW: 12.4 % (ref 11.5–15.5)
WBC: 8.2 10*3/uL (ref 4.0–10.5)

## 2015-06-12 MED ORDER — SACCHAROMYCES BOULARDII 250 MG PO CAPS: 250.0000 mg | ORAL_CAPSULE | Freq: Two times a day (BID) | ORAL | Status: AC

## 2015-06-12 MED ORDER — OXYCODONE-ACETAMINOPHEN 5-325 MG PO TABS: 1.0000 | ORAL_TABLET | ORAL | Status: AC | PRN

## 2015-06-12 MED ORDER — POTASSIUM CHLORIDE CRYS ER 20 MEQ PO TBCR
40.0000 meq | EXTENDED_RELEASE_TABLET | Freq: Once | ORAL | Status: AC
  Administered 2015-06-12: 40 meq via ORAL
  Filled 2015-06-12: qty 2

## 2015-06-12 MED ORDER — OXYCODONE-ACETAMINOPHEN 5-325 MG PO TABS
1.0000 | ORAL_TABLET | ORAL | Status: DC | PRN
  Administered 2015-06-12: 2 via ORAL
  Filled 2015-06-12: qty 2

## 2015-06-12 NOTE — Discharge Summary (Signed)
Physician Discharge Summary  Juan Ingram ZOX:[id number] DOB: [birth year] DOA: 06/07/2015  PCP: Farris Has, MD  Admit date: 06/07/2015 Discharge date: 06/12/2015  Recommendations for Outpatient Follow-up:  1. F/u with Dr. Dulce Sellar as needed  Discharge Diagnoses:  Principal Problem:   Rotavirus enteritis Active Problems:   Exacerbation of Crohn's disease (HCC)   AKI (acute kidney injury) (HCC)   Hyponatremia   Crohn's colitis (HCC)   Acute renal failure superimposed on stage 3 chronic kidney disease (HCC)   Discharge Condition: stable, improved  Diet recommendation: Regular diet  Wt Readings from Last 3 Encounters:  06/11/15 72.848 kg (160 lb 9.6 oz)  04/18/15 65.772 kg (145 lb)  10/31/13 65.772 kg (145 lb)    History of present illness:  The patient is a 30 year old male with history of Crohn's disease status post colectomy who presented with abdominal pain and diarrhea.    Hospital Course:  Rotavirus enteritis with leukocytosis.  Initially there was concern for a Crohn's flare.  Gastroenterology was consulted and recommended holding off on steroids.  His GI pathogen panel was positive for rotavirus. He had been exposed to 2 nephews and he works in [employer].  He was given supportive care with IV fluids and his diarrhea slowed. He was able to tolerate a regular diet and felt that he could stay hydrated despite some mild ongoing stool frequency. At baseline, he reported he has approximately 7-10 bowel movements and currently he is having about 15-20.  His stool has become less watery and his leukocytosis resolved.  Gastroenterology recommended outpatient follow-up with Dr. Dulce Sellar routinely.  He may resume his Humira.   Dehydration and hypokalemia secondary to gastroenteritis.  Resolved with IV fluids and potassium supplementation.   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 Evette Cristal, primary Dr. Dulce Sellar  Discharge Exam: Filed Vitals:   06/12/15 0544 06/12/15 0934  BP: 109/67 114/63  Pulse: 49 58  Temp: 98.2 F (36.8 C) 98.2 F (36.8 C)  Resp: 16 18   Filed Vitals:   06/11/15 2000 06/11/15 2148 06/12/15 0544 06/12/15 0934  BP: 124/68 121/67 109/67 114/63  Pulse: 55 61 49 58  Temp:  98.4 F (36.9 C) 98.2 F (36.8 C) 98.2 F (36.8 C)  TempSrc:  Oral Oral Oral  Resp:  15 16 18   Weight:  72.848 kg (160 lb 9.6 oz)    SpO2:  99% 99% 98%    General: Adult male, no acute distress Cardiovascular: Regular rate and rhythm, no murmurs Pulmonary: CTAB, no increased WOB ABD:  Hyperactive BS, soft, mildly TTP diffusely without rebound or guarding MSK:  No LEE, normal tone and bulk.  Discharge Instructions      Discharge Instructions    Call MD for:  difficulty breathing, headache or visual disturbances    Complete by:  As directed      Call MD for:  extreme fatigue    Complete by:  As directed      Call MD for:  hives    Complete by:  As directed      Call MD for:  persistant dizziness or light-headedness    Complete by:  As directed      Call MD for:  persistant nausea and vomiting    Complete by:  As directed      Call MD for:  severe uncontrolled pain    Complete by:  As directed      Call MD for:  temperature >100.4    Complete by:  As directed      Diet general    Complete by:  As directed      Discharge instructions    Complete by:  As directed   No driving or operating heavy machinery while taking oxycodone     Increase activity slowly    Complete by:  As directed             Medication List    TAKE these medications        acidophilus Caps capsule  Take 1 capsule by mouth daily.     eszopiclone 1 MG Tabs tablet  Commonly known as:  LUNESTA  Take 1 mg by mouth at bedtime as needed for sleep.     HUMIRA 40 MG/0.8ML Pskt  Generic drug:  Adalimumab  Inject 40 mg into the  skin every 14 (fourteen) days.     omeprazole 20 MG capsule  Commonly known as:  PRILOSEC  Take 1 capsule (20 mg total) by mouth daily.     ondansetron 8 MG tablet  Commonly known as:  ZOFRAN  Take 8 mg by mouth every 8 (eight) hours as needed for nausea.     oxyCODONE-acetaminophen 5-325 MG tablet  Commonly known as:  PERCOCET/ROXICET  Take 1 tablet by mouth every 4 (four) hours as needed for severe pain.     saccharomyces boulardii 250 MG capsule  Commonly known as:  FLORASTOR  Take 1 capsule (250 mg total) by mouth 2 (two) times daily.       Follow-up Information    Schedule an appointment as soon as possible for a visit with Farris Has, MD.   Specialty:  Family Medicine   Why:  As needed   Contact information:   [address] [place] [address] [phone]       Schedule an appointment as soon as possible for a visit with Freddy Jaksch, MD.   Specialty:  Gastroenterology   Why:  As needed   Contact information:   [address] [place] [address] [phone]        The results of significant diagnostics from this hospitalization (including imaging, microbiology, ancillary and laboratory) are listed below for reference.    Significant Diagnostic Studies: Dg Abd Acute W/chest  06/07/2015  CLINICAL DATA:  Initial evaluation for acute abdominal pain with vomiting for 1 day. History of Crohn's. EXAM: DG ABDOMEN ACUTE W/ 1V CHEST COMPARISON:  Prior study from 04/21/2012. FINDINGS: Cardiac and mediastinal silhouettes are within normal limits. Lungs are normally inflated. No focal infiltrate, pulmonary edema, or pleural effusion. No pneumothorax. Bowel gas pattern within normal limits without evidence for obstruction or ileus. No free air. No soft tissue mass or abnormal calcification. Two surgical clips overlie the left abdomen. Scoliosis with spinal fusion hardware present in the lumbar spine. No acute osseous abnormality.  IMPRESSION: 1. Nonobstructive bowel gas pattern with no radiographic evidence for acute intra-abdominal process. 2. No active cardiopulmonary disease. Electronically Signed   By: Rise Mu M.D.   On: 06/07/2015 04:24    Microbiology: Recent Results (from the past 240 hour(s))  MRSA PCR Screening     Status: None   Collection Time: 06/07/15  7:01 AM  Result Value Ref Range Status   MRSA by PCR NEGATIVE NEGATIVE Final    Comment:        The GeneXpert  MRSA Assay (FDA approved for NASAL specimens only), is one component of a comprehensive MRSA colonization surveillance program. It is not intended to diagnose MRSA infection nor to guide or monitor treatment for MRSA infections.   C difficile quick scan w PCR reflex     Status: None   Collection Time: 06/07/15 11:04 AM  Result Value Ref Range Status   C Diff antigen NEGATIVE NEGATIVE Final   C Diff toxin NEGATIVE NEGATIVE Final   C Diff interpretation Negative for toxigenic C. difficile  Final  Gastrointestinal Panel by PCR , Stool     Status: Abnormal   Collection Time: 06/07/15 11:04 AM  Result Value Ref Range Status   Campylobacter species NOT DETECTED NOT DETECTED Final   Plesimonas shigelloides NOT DETECTED NOT DETECTED Final   Salmonella species NOT DETECTED NOT DETECTED Final   Yersinia enterocolitica NOT DETECTED NOT DETECTED Final   Vibrio species NOT DETECTED NOT DETECTED Final   Vibrio cholerae NOT DETECTED NOT DETECTED Final   Enteroaggregative E coli (EAEC) NOT DETECTED NOT DETECTED Final   Enteropathogenic E coli (EPEC) NOT DETECTED NOT DETECTED Final   Enterotoxigenic E coli (ETEC) NOT DETECTED NOT DETECTED Final   Shiga like toxin producing E coli (STEC) NOT DETECTED NOT DETECTED Final   E. coli O157 NOT DETECTED NOT DETECTED Final   Shigella/Enteroinvasive E coli (EIEC) NOT DETECTED NOT DETECTED Final   Cryptosporidium NOT DETECTED NOT DETECTED Final   Cyclospora cayetanensis NOT DETECTED NOT DETECTED  Final   Entamoeba histolytica NOT DETECTED NOT DETECTED Final   Giardia lamblia NOT DETECTED NOT DETECTED Final   Adenovirus F40/41 NOT DETECTED NOT DETECTED Final   Astrovirus NOT DETECTED NOT DETECTED Final   Norovirus GI/GII NOT DETECTED NOT DETECTED Final   Rotavirus A DETECTED (A) NOT DETECTED Final   Sapovirus (I, II, IV, and V) NOT DETECTED NOT DETECTED Final     Labs: Basic Metabolic Panel:  Recent Labs Lab 06/07/15 0219 06/08/15 0459 06/10/15 0845 06/12/15 0400  NA 134* 135 139 140  K 4.0 4.5 3.3* 3.2*  CL 97* 105 107 106  CO2 17* 20* 22 25  GLUCOSE 146* 113* 88 89  BUN 9 13 6  <5*  CREATININE 2.36* 0.89 0.69 0.70  CALCIUM 10.1 8.4* 8.4* 8.4*  MG  --   --  1.9  --    Liver Function Tests:  Recent Labs Lab 06/07/15 0219 06/08/15 0459  AST 32 24  ALT 21 15*  ALKPHOS 90 56  BILITOT 0.6 0.5  PROT 10.0* 7.3  ALBUMIN 4.8 3.4*    Recent Labs Lab 06/07/15 0219  LIPASE 24   No results for input(s): AMMONIA in the last 168 hours. CBC:  Recent Labs Lab 06/07/15 0219 06/08/15 0459 06/12/15 0400  WBC 15.1* 12.3* 8.2  HGB 16.7 13.5 12.7*  HCT 49.2 39.8 39.0  MCV 92.0 93.2 93.1  PLT 339 283 276   Cardiac Enzymes: No results for input(s): CKTOTAL, CKMB, CKMBINDEX, TROPONINI in the last 168 hours. BNP: BNP (last 3 results) No results for input(s): BNP in the last 8760 hours.  ProBNP (last 3 results) No results for input(s): PROBNP in the last 8760 hours.  CBG:  Recent Labs Lab 06/07/15 2000  GLUCAP 142*    Time coordinating discharge: 35 minutes  Signed:  Abraham Entwistle  [facility] 06/12/2015, 3:23 PM

## 2015-06-12 NOTE — Progress Notes (Signed)
Patient is feeling much better and would like to go home  Abdomen is soft  IMP: Rotovirus  Plan: OK with GI for discharge. He can follow up with Dr Dulce Sellar routinely.

## 2015-08-26 DIAGNOSIS — N529 Male erectile dysfunction, unspecified: Secondary | ICD-10-CM | POA: Diagnosis not present

## 2015-08-26 DIAGNOSIS — G4700 Insomnia, unspecified: Secondary | ICD-10-CM | POA: Diagnosis not present

## 2015-10-28 DIAGNOSIS — Z118 Encounter for screening for other infectious and parasitic diseases: Secondary | ICD-10-CM | POA: Diagnosis not present

## 2015-10-28 DIAGNOSIS — Z113 Encounter for screening for infections with a predominantly sexual mode of transmission: Secondary | ICD-10-CM | POA: Diagnosis not present

## 2015-10-28 DIAGNOSIS — N5201 Erectile dysfunction due to arterial insufficiency: Secondary | ICD-10-CM | POA: Diagnosis not present

## 2016-01-27 DIAGNOSIS — K5010 Crohn's disease of large intestine without complications: Secondary | ICD-10-CM | POA: Diagnosis not present

## 2016-03-16 DIAGNOSIS — N529 Male erectile dysfunction, unspecified: Secondary | ICD-10-CM | POA: Diagnosis not present

## 2016-03-16 DIAGNOSIS — G4700 Insomnia, unspecified: Secondary | ICD-10-CM | POA: Diagnosis not present

## 2016-04-17 DIAGNOSIS — F5221 Male erectile disorder: Secondary | ICD-10-CM | POA: Diagnosis not present

## 2016-07-24 ENCOUNTER — Ambulatory Visit
Admission: RE | Admit: 2016-07-24 | Discharge: 2016-07-24 | Disposition: A | Discharge status: Home / Self Care | Payer: BLUE CROSS/BLUE SHIELD | Source: Ambulatory Visit | Attending: Family Medicine | Admitting: Family Medicine

## 2016-07-24 ENCOUNTER — Other Ambulatory Visit: Payer: Self-pay | Admitting: Family Medicine

## 2016-07-24 DIAGNOSIS — M79645 Pain in left finger(s): Secondary | ICD-10-CM

## 2016-07-24 DIAGNOSIS — S6990XA Unspecified injury of unspecified wrist, hand and finger(s), initial encounter: Secondary | ICD-10-CM | POA: Diagnosis not present

## 2016-07-24 DIAGNOSIS — B079 Viral wart, unspecified: Secondary | ICD-10-CM | POA: Diagnosis not present

## 2016-07-24 DIAGNOSIS — G4700 Insomnia, unspecified: Secondary | ICD-10-CM | POA: Diagnosis not present

## 2016-11-04 DIAGNOSIS — Z23 Encounter for immunization: Secondary | ICD-10-CM | POA: Diagnosis not present

## 2016-11-04 DIAGNOSIS — Z113 Encounter for screening for infections with a predominantly sexual mode of transmission: Secondary | ICD-10-CM | POA: Diagnosis not present

## 2016-11-04 DIAGNOSIS — G4700 Insomnia, unspecified: Secondary | ICD-10-CM | POA: Diagnosis not present

## 2017-04-23 DIAGNOSIS — F411 Generalized anxiety disorder: Secondary | ICD-10-CM | POA: Diagnosis not present

## 2017-04-23 DIAGNOSIS — Z113 Encounter for screening for infections with a predominantly sexual mode of transmission: Secondary | ICD-10-CM | POA: Diagnosis not present

## 2017-04-23 DIAGNOSIS — M25579 Pain in unspecified ankle and joints of unspecified foot: Secondary | ICD-10-CM | POA: Diagnosis not present

## 2017-04-23 DIAGNOSIS — G4700 Insomnia, unspecified: Secondary | ICD-10-CM | POA: Diagnosis not present

## 2017-04-23 DIAGNOSIS — R110 Nausea: Secondary | ICD-10-CM | POA: Diagnosis not present

## 2017-05-10 DIAGNOSIS — M25531 Pain in right wrist: Secondary | ICD-10-CM | POA: Diagnosis not present

## 2017-05-10 DIAGNOSIS — S56811A Strain of other muscles, fascia and tendons at forearm level, right arm, initial encounter: Secondary | ICD-10-CM | POA: Diagnosis not present

## 2017-09-23 DIAGNOSIS — B356 Tinea cruris: Secondary | ICD-10-CM | POA: Diagnosis not present

## 2017-09-23 DIAGNOSIS — N529 Male erectile dysfunction, unspecified: Secondary | ICD-10-CM | POA: Diagnosis not present

## 2017-10-29 DIAGNOSIS — G4700 Insomnia, unspecified: Secondary | ICD-10-CM | POA: Diagnosis not present

## 2018-05-17 DIAGNOSIS — L989 Disorder of the skin and subcutaneous tissue, unspecified: Secondary | ICD-10-CM | POA: Diagnosis not present

## 2018-05-17 DIAGNOSIS — G4700 Insomnia, unspecified: Secondary | ICD-10-CM | POA: Diagnosis not present

## 2018-05-17 DIAGNOSIS — M549 Dorsalgia, unspecified: Secondary | ICD-10-CM | POA: Diagnosis not present

## 2018-05-17 DIAGNOSIS — L309 Dermatitis, unspecified: Secondary | ICD-10-CM | POA: Diagnosis not present

## 2018-09-17 DIAGNOSIS — Z20828 Contact with and (suspected) exposure to other viral communicable diseases: Secondary | ICD-10-CM | POA: Diagnosis not present

## 2018-09-17 DIAGNOSIS — U071 COVID-19: Secondary | ICD-10-CM | POA: Diagnosis not present

## 2018-12-14 DIAGNOSIS — J029 Acute pharyngitis, unspecified: Secondary | ICD-10-CM | POA: Diagnosis not present

## 2018-12-14 DIAGNOSIS — R509 Fever, unspecified: Secondary | ICD-10-CM | POA: Diagnosis not present

## 2018-12-15 DIAGNOSIS — J029 Acute pharyngitis, unspecified: Secondary | ICD-10-CM | POA: Diagnosis not present

## 2018-12-15 DIAGNOSIS — R509 Fever, unspecified: Secondary | ICD-10-CM | POA: Diagnosis not present

## 2018-12-27 DIAGNOSIS — L404 Guttate psoriasis: Secondary | ICD-10-CM | POA: Diagnosis not present

## 2019-01-03 DIAGNOSIS — L404 Guttate psoriasis: Secondary | ICD-10-CM | POA: Diagnosis not present

## 2019-01-03 DIAGNOSIS — D485 Neoplasm of uncertain behavior of skin: Secondary | ICD-10-CM | POA: Diagnosis not present

## 2019-01-03 DIAGNOSIS — L820 Inflamed seborrheic keratosis: Secondary | ICD-10-CM | POA: Diagnosis not present

## 2019-01-05 DIAGNOSIS — Z13220 Encounter for screening for lipoid disorders: Secondary | ICD-10-CM | POA: Diagnosis not present

## 2019-01-05 DIAGNOSIS — K5010 Crohn's disease of large intestine without complications: Secondary | ICD-10-CM | POA: Diagnosis not present

## 2019-01-05 DIAGNOSIS — Z0000 Encounter for general adult medical examination without abnormal findings: Secondary | ICD-10-CM | POA: Diagnosis not present

## 2019-01-05 DIAGNOSIS — Z111 Encounter for screening for respiratory tuberculosis: Secondary | ICD-10-CM | POA: Diagnosis not present

## 2019-01-13 IMAGING — CR DG FINGER RING 2+V*L*
3 series · 3 of 3 positions shown · non-contrast
Comparison: None.

CLINICAL DATA: Pain to the left ring finger for 2 weeks

EXAM:
LEFT RING FINGER 2+V

[x finger pa left]
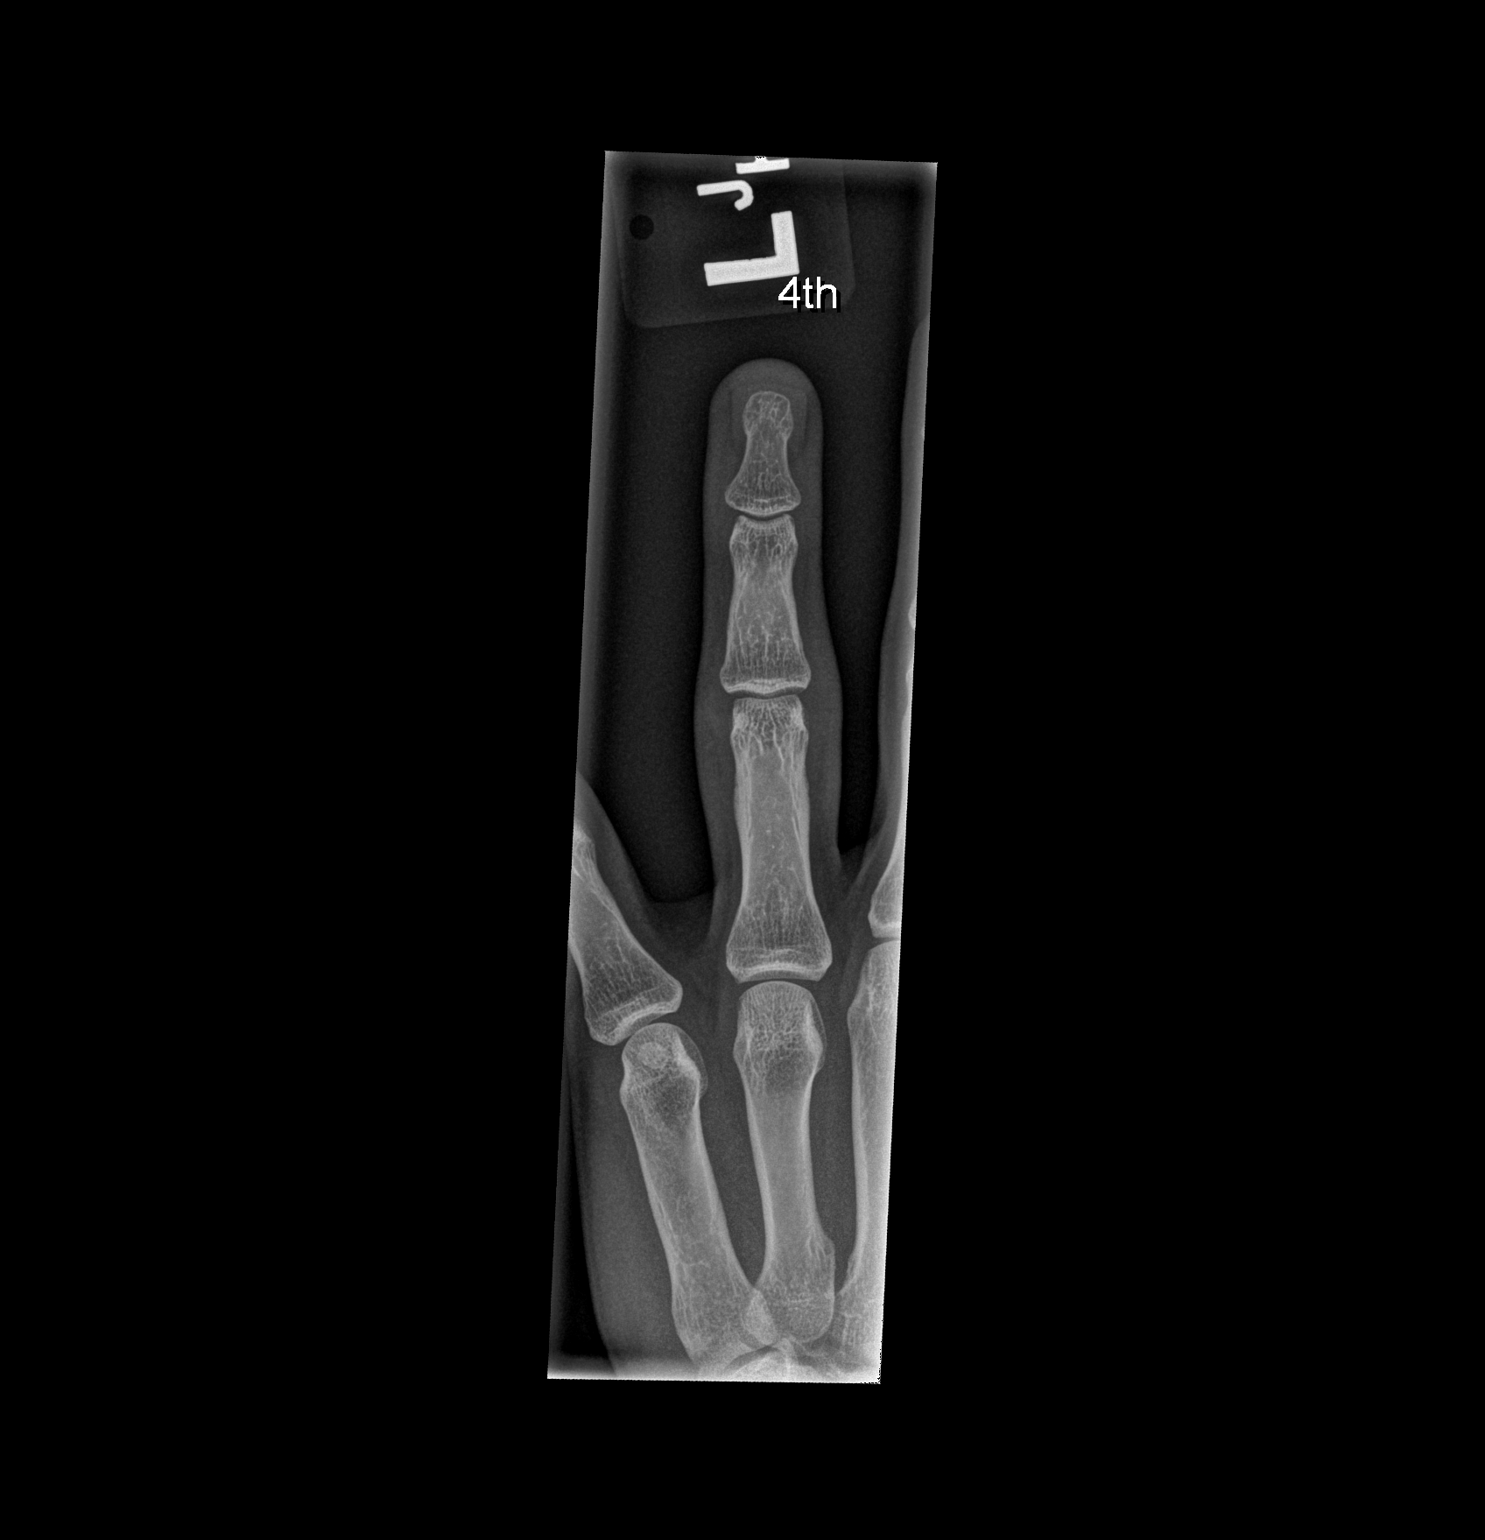

[x finger obl left]
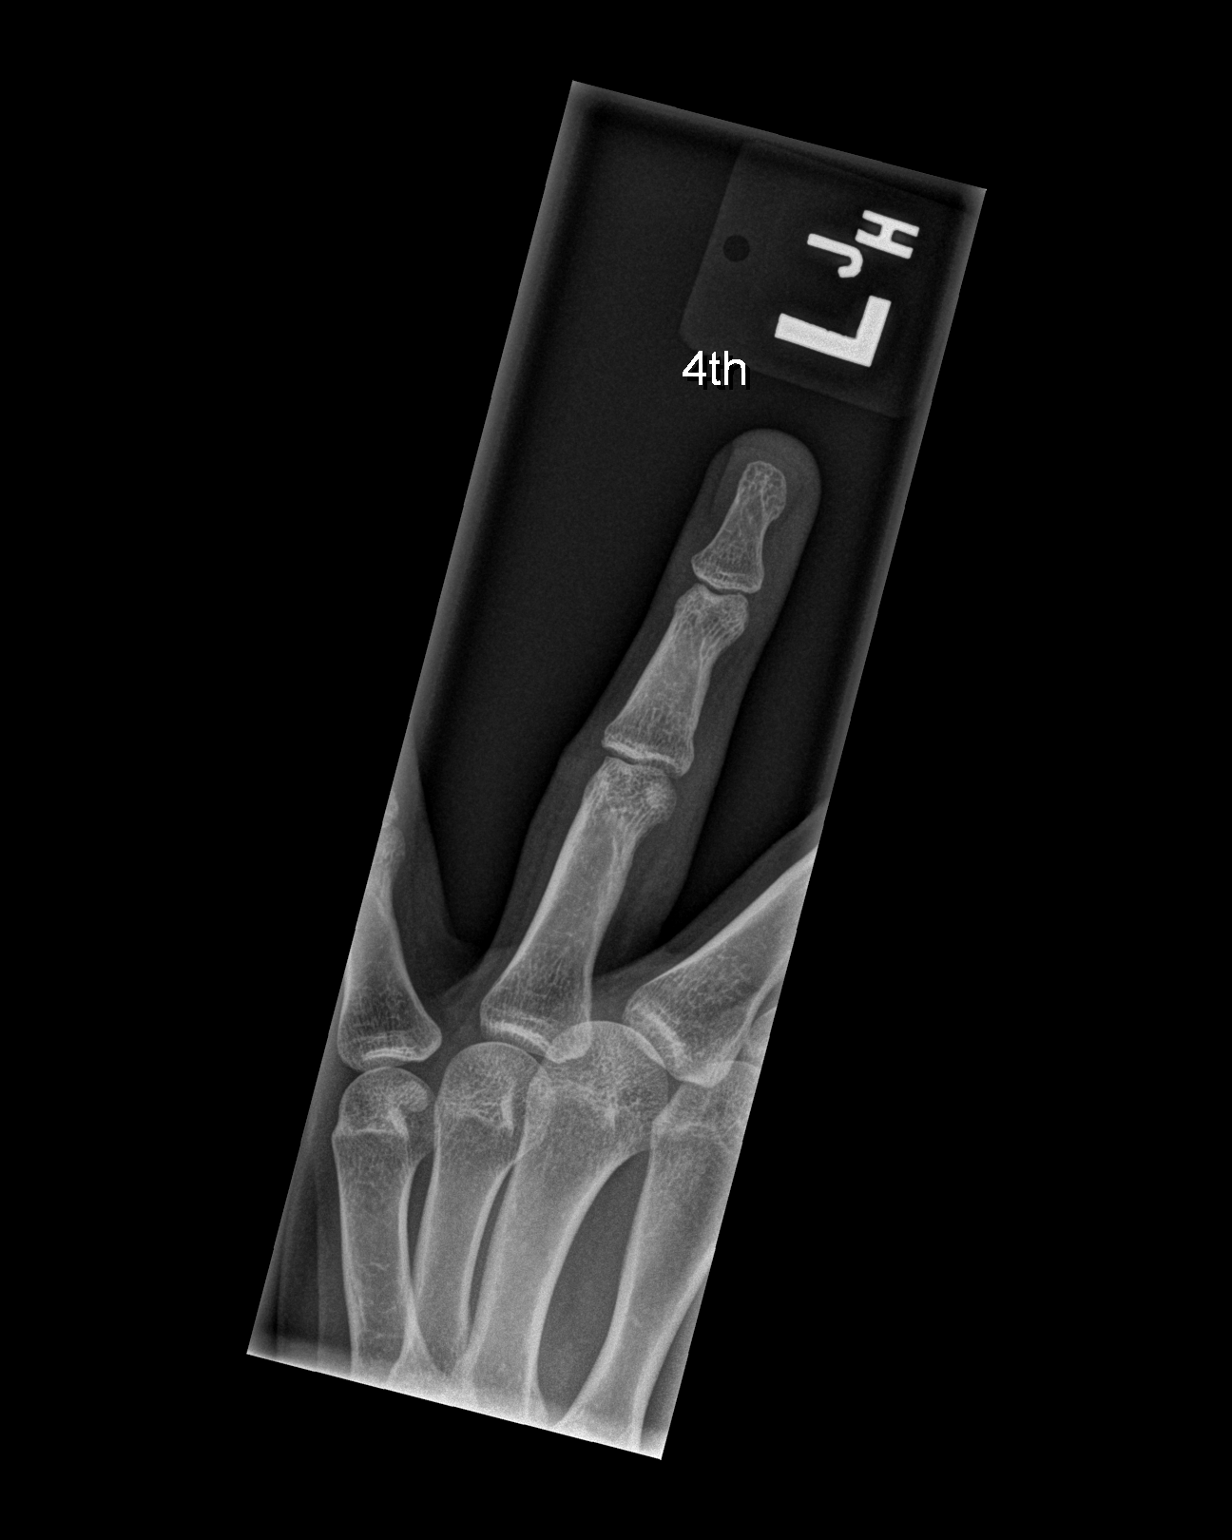

[x finger lat left]
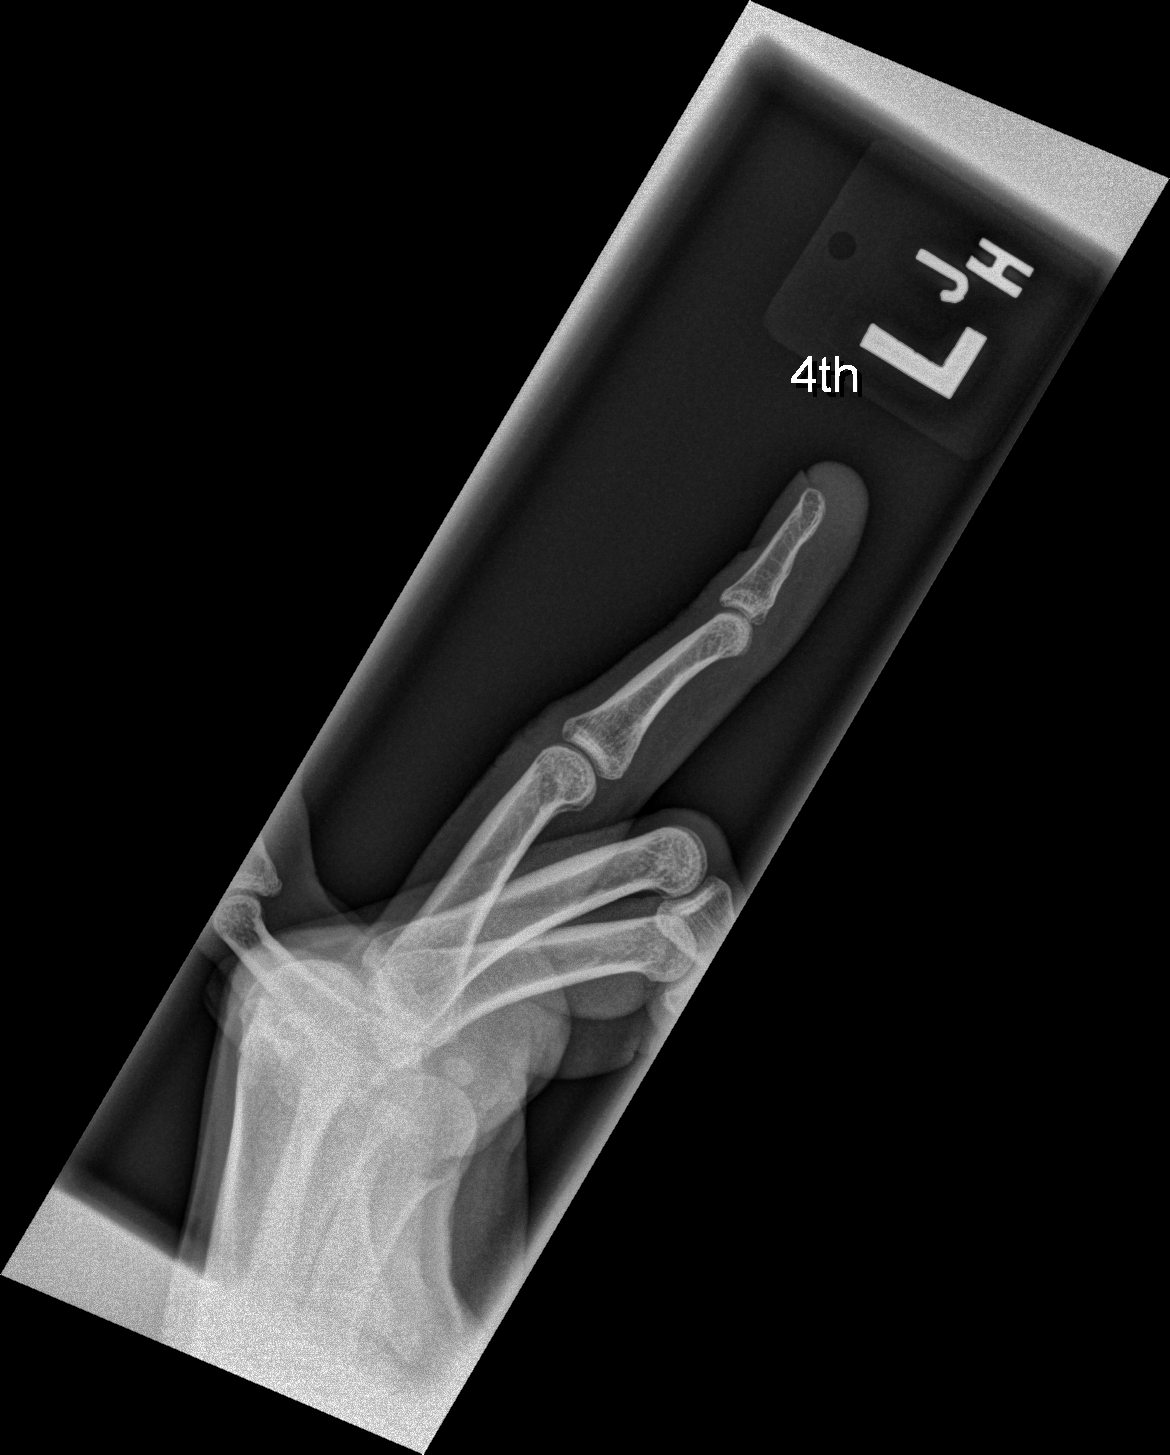

[3 of 3 positions shown; findings below may reference images not displayed]

FINDINGS: There is no evidence of fracture or dislocation. There is no
evidence of arthropathy or other focal bone abnormality. Soft
tissues are unremarkable.
IMPRESSION: Negative.

## 2019-02-07 DIAGNOSIS — L400 Psoriasis vulgaris: Secondary | ICD-10-CM | POA: Diagnosis not present

## 2019-08-21 DIAGNOSIS — G4700 Insomnia, unspecified: Secondary | ICD-10-CM | POA: Diagnosis not present

## 2019-08-21 DIAGNOSIS — K5010 Crohn's disease of large intestine without complications: Secondary | ICD-10-CM | POA: Diagnosis not present

## 2019-08-21 DIAGNOSIS — L404 Guttate psoriasis: Secondary | ICD-10-CM | POA: Diagnosis not present

## 2019-08-21 DIAGNOSIS — M8580 Other specified disorders of bone density and structure, unspecified site: Secondary | ICD-10-CM | POA: Diagnosis not present

## 2019-08-25 ENCOUNTER — Other Ambulatory Visit: Payer: Self-pay | Admitting: Family Medicine

## 2019-08-25 DIAGNOSIS — M8580 Other specified disorders of bone density and structure, unspecified site: Secondary | ICD-10-CM

## 2019-08-25 DIAGNOSIS — K5090 Crohn's disease, unspecified, without complications: Secondary | ICD-10-CM

## 2019-08-31 DIAGNOSIS — M79645 Pain in left finger(s): Secondary | ICD-10-CM | POA: Diagnosis not present

## 2019-08-31 DIAGNOSIS — M25572 Pain in left ankle and joints of left foot: Secondary | ICD-10-CM | POA: Diagnosis not present

## 2019-10-26 ENCOUNTER — Other Ambulatory Visit: Payer: BLUE CROSS/BLUE SHIELD

## 2020-05-07 DIAGNOSIS — N529 Male erectile dysfunction, unspecified: Secondary | ICD-10-CM | POA: Diagnosis not present

## 2020-05-07 DIAGNOSIS — D699 Hemorrhagic condition, unspecified: Secondary | ICD-10-CM | POA: Diagnosis not present

## 2020-05-07 DIAGNOSIS — G4700 Insomnia, unspecified: Secondary | ICD-10-CM | POA: Diagnosis not present

## 2020-05-07 DIAGNOSIS — R946 Abnormal results of thyroid function studies: Secondary | ICD-10-CM | POA: Diagnosis not present

## 2020-05-15 DIAGNOSIS — D699 Hemorrhagic condition, unspecified: Secondary | ICD-10-CM | POA: Diagnosis not present

## 2020-05-15 DIAGNOSIS — R7989 Other specified abnormal findings of blood chemistry: Secondary | ICD-10-CM | POA: Diagnosis not present

## 2020-05-15 DIAGNOSIS — R945 Abnormal results of liver function studies: Secondary | ICD-10-CM | POA: Diagnosis not present

## 2020-05-27 ENCOUNTER — Other Ambulatory Visit: Payer: Self-pay | Admitting: Family Medicine

## 2020-05-27 DIAGNOSIS — R7989 Other specified abnormal findings of blood chemistry: Secondary | ICD-10-CM

## 2020-06-14 ENCOUNTER — Other Ambulatory Visit: Payer: BLUE CROSS/BLUE SHIELD

## 2020-07-02 ENCOUNTER — Other Ambulatory Visit: Payer: BLUE CROSS/BLUE SHIELD

## 2020-07-03 ENCOUNTER — Ambulatory Visit
Admission: RE | Admit: 2020-07-03 | Discharge: 2020-07-03 | Disposition: A | Discharge status: Home / Self Care | Payer: BLUE CROSS/BLUE SHIELD | Source: Ambulatory Visit | Attending: Family Medicine | Admitting: Family Medicine

## 2020-07-03 DIAGNOSIS — R7989 Other specified abnormal findings of blood chemistry: Secondary | ICD-10-CM

## 2020-07-03 DIAGNOSIS — R945 Abnormal results of liver function studies: Secondary | ICD-10-CM | POA: Diagnosis not present

## 2021-12-12 ENCOUNTER — Encounter (HOSPITAL_COMMUNITY): Payer: Self-pay

## 2021-12-12 ENCOUNTER — Emergency Department (HOSPITAL_COMMUNITY)
Admission: EM | Admit: 2021-12-12 | Discharge: 2021-12-12 | Discharge status: Against Medical Advice | Payer: BC Managed Care – PPO | Attending: Emergency Medicine | Admitting: Emergency Medicine

## 2021-12-12 ENCOUNTER — Other Ambulatory Visit: Payer: Self-pay

## 2021-12-12 DIAGNOSIS — Z5321 Procedure and treatment not carried out due to patient leaving prior to being seen by health care provider: Secondary | ICD-10-CM | POA: Insufficient documentation

## 2021-12-12 DIAGNOSIS — Z7722 Contact with and (suspected) exposure to environmental tobacco smoke (acute) (chronic): Secondary | ICD-10-CM | POA: Diagnosis not present

## 2021-12-12 DIAGNOSIS — R200 Anesthesia of skin: Secondary | ICD-10-CM | POA: Insufficient documentation

## 2021-12-12 DIAGNOSIS — R110 Nausea: Secondary | ICD-10-CM | POA: Diagnosis not present

## 2021-12-12 DIAGNOSIS — R202 Paresthesia of skin: Secondary | ICD-10-CM | POA: Diagnosis not present

## 2021-12-12 LAB — COMPREHENSIVE METABOLIC PANEL
ALT: 55 U/L — ABNORMAL HIGH (ref 0–44)
AST: 140 U/L — ABNORMAL HIGH (ref 15–41)
Albumin: 4.2 g/dL (ref 3.5–5.0)
Alkaline Phosphatase: 72 U/L (ref 38–126)
Anion gap: 13 (ref 5–15)
BUN: <5 mg/dL — ABNORMAL LOW (ref 6–20)
CO2: 24 mmol/L (ref 22–32)
Calcium: 8.8 mg/dL — ABNORMAL LOW (ref 8.9–10.3)
Chloride: 102 mmol/L (ref 98–111)
Creatinine, Ser: 0.66 mg/dL (ref 0.61–1.24)
GFR, Estimated: >60 mL/min (ref >60)
Glucose, Bld: 96 mg/dL (ref 70–99)
Potassium: 3.5 mmol/L (ref 3.5–5.1)
Sodium: 139 mmol/L (ref 135–145)
Total Bilirubin: 0.6 mg/dL (ref 0.3–1.2)
Total Protein: 7.9 g/dL (ref 6.5–8.1)

## 2021-12-12 LAB — CBC WITH DIFFERENTIAL/PLATELET
Abs Immature Granulocytes: 0.01 10*3/uL (ref 0.00–0.07)
Basophils Absolute: 0.2 10*3/uL — ABNORMAL HIGH (ref 0.0–0.1)
Basophils Relative: 3 %
Eosinophils Absolute: 0.2 10*3/uL (ref 0.0–0.5)
Eosinophils Relative: 4 %
HCT: 41.2 % (ref 39.0–52.0)
Hemoglobin: 14.3 g/dL (ref 13.0–17.0)
Immature Granulocytes: 0 %
Lymphocytes Relative: 23 %
Lymphs Abs: 1.2 10*3/uL (ref 0.7–4.0)
MCH: 33.4 pg (ref 26.0–34.0)
MCHC: 34.7 g/dL (ref 30.0–36.0)
MCV: 96.3 fL (ref 80.0–100.0)
Monocytes Absolute: 0.8 10*3/uL (ref 0.1–1.0)
Monocytes Relative: 14 %
Neutro Abs: 3 10*3/uL (ref 1.7–7.7)
Neutrophils Relative %: 56 %
Platelets: 149 10*3/uL — ABNORMAL LOW (ref 150–400)
RBC: 4.28 MIL/uL (ref 4.22–5.81)
RDW: 12.1 % (ref 11.5–15.5)
WBC: 5.3 10*3/uL (ref 4.0–10.5)
nRBC: 0 % (ref 0.0–0.2)

## 2021-12-12 LAB — URINALYSIS, ROUTINE W REFLEX MICROSCOPIC
Bacteria, UA: NONE SEEN
Bilirubin Urine: NEGATIVE
Glucose, UA: NEGATIVE mg/dL
Ketones, ur: NEGATIVE mg/dL
Leukocytes,Ua: NEGATIVE
Nitrite: NEGATIVE
Protein, ur: 30 mg/dL — AB
Specific Gravity, Urine: 1.006 (ref 1.005–1.030)
pH: 5 (ref 5.0–8.0)

## 2021-12-12 LAB — LIPASE, BLOOD: Lipase: 74 U/L — ABNORMAL HIGH (ref 11–51)

## 2021-12-12 LAB — ETHANOL: Alcohol, Ethyl (B): 300 mg/dL — ABNORMAL HIGH (ref <10)

## 2021-12-12 NOTE — ED Notes (Signed)
Call for bed assignment x3

## 2021-12-12 NOTE — ED Provider Triage Note (Signed)
Emergency Medicine Provider Triage Evaluation Note  Juan Ingram , a 36 y.o. male  was evaluated in triage.  Pt complains of nausea, decreased focus, and mild numbness of right thumb and bottom lip.  Nausea has been chronic over the last 6 months or so, worse in the last 2 weeks.  Decreased focus over the last week.  Mild numbness over the last 24-48 hours.  Reports being an alcoholic, consuming 33-35 beers per day "easily".  Was recommended by PCP to get an ultrasound of the liver due to elevated liver enzymes, however did not go.  Hx of Crohn's disease and prior colectomy.  Also reports dark tarry stools.  Denies chest pain or shortness of breath.  Chronic tobacco use.  Review of Systems  Positive: See above Negative:   Physical Exam  BP (!) 152/109 (BP Location: Left Arm)   Pulse 79   Temp 97.9 F (36.6 C) (Oral)   Resp 18   SpO2 96%  Gen:   Awake, no distress   Resp:  Normal effort, CTAB MSK:   Moves extremities without difficulty  Other:  Abdomen soft, nontender.  PERRLA.  Gaze aligned appropriately.  Negative BEFAST.    Medical Decision Making  Medically screening exam initiated at 12:20 PM.  Appropriate orders placed.  [name] was informed that the remainder of the evaluation will be completed by another provider, this initial triage assessment does not replace that evaluation, and the importance of remaining in the ED until their evaluation is complete.     Prince Rome, PA-C 45/62/56 1224

## 2021-12-12 NOTE — ED Triage Notes (Addendum)
Pt came in [facility] with c/o feeling like the last week he has had a loss of focus, feeling nauseous & vomited once every 3 days for the last week, does have Hx of crones disease. Pt also states his Rt thumb & bottom lip has been feeling numb the past 24 hrs. A/Ox4, no pain, denies any falls or recent injuries. Pt added to this RN that he is an alcoholic, his last drink was this morning & he has drank less at this point of the day than he usually does. States that he was advised to get a [place] of his liver by his PCP but he never did.

## 2021-12-17 DIAGNOSIS — I10 Essential (primary) hypertension: Secondary | ICD-10-CM | POA: Diagnosis not present

## 2021-12-17 DIAGNOSIS — R945 Abnormal results of liver function studies: Secondary | ICD-10-CM | POA: Diagnosis not present

## 2021-12-17 DIAGNOSIS — F419 Anxiety disorder, unspecified: Secondary | ICD-10-CM | POA: Diagnosis not present

## 2022-12-23 IMAGING — US US ABDOMEN LIMITED RUQ/ASCITES
1 series · 14 of 25 positions shown · non-contrast
Comparison: CT 04/21/2012

CLINICAL DATA: Increased liver function tests.

EXAM:
ULTRASOUND ABDOMEN LIMITED RIGHT UPPER QUADRANT

[Series 1: us abdomen limited ruq/ascites · 0.15mm/px · 14 of 43 slices shown]
[im 1/43]
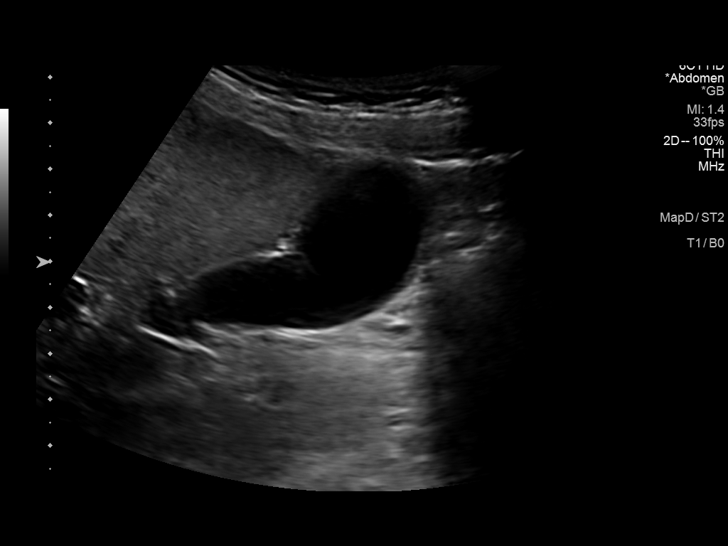
[im 4/43]
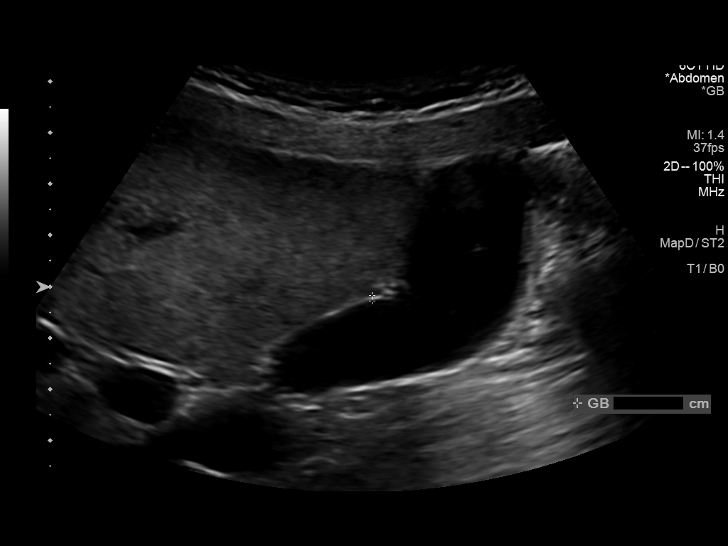
[im 8/43]
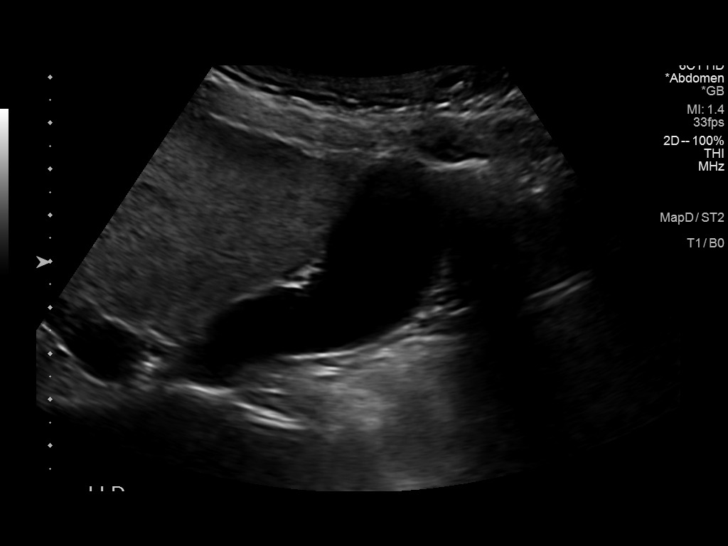
[im 11/43]
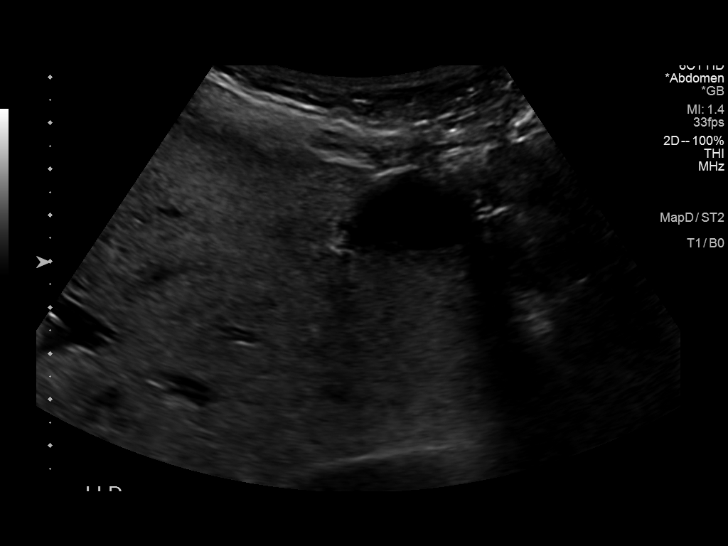
[im 15/43]
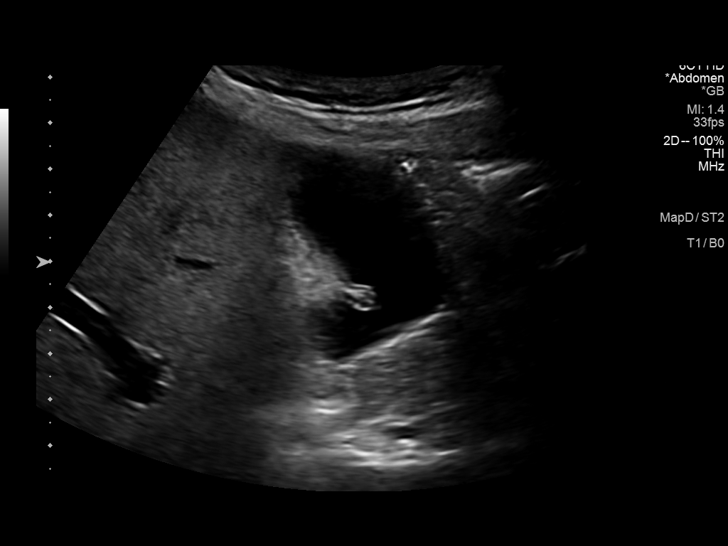
[im 16/43]
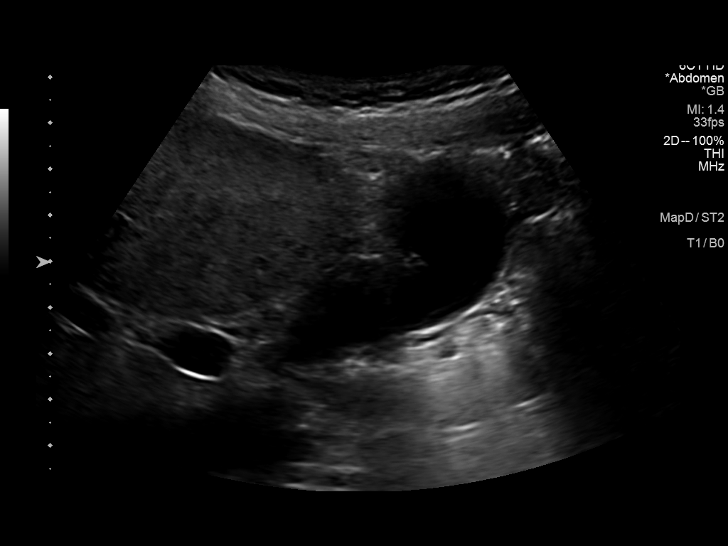
[im 20/43]
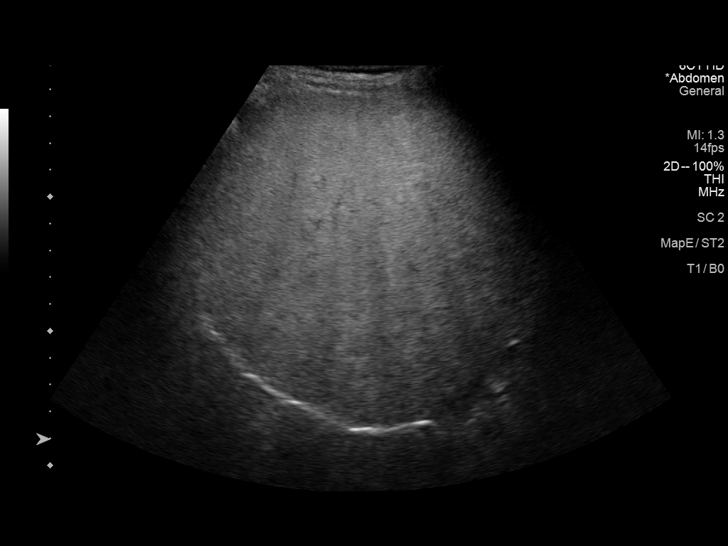
[im 23/43]
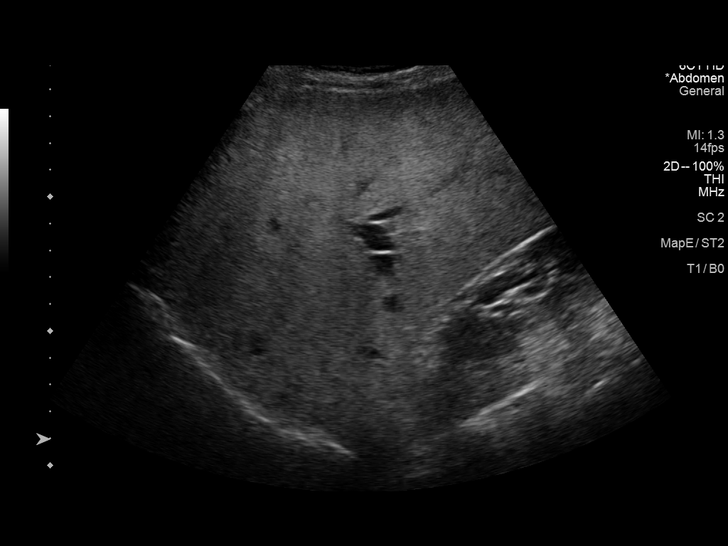
[im 27/43]
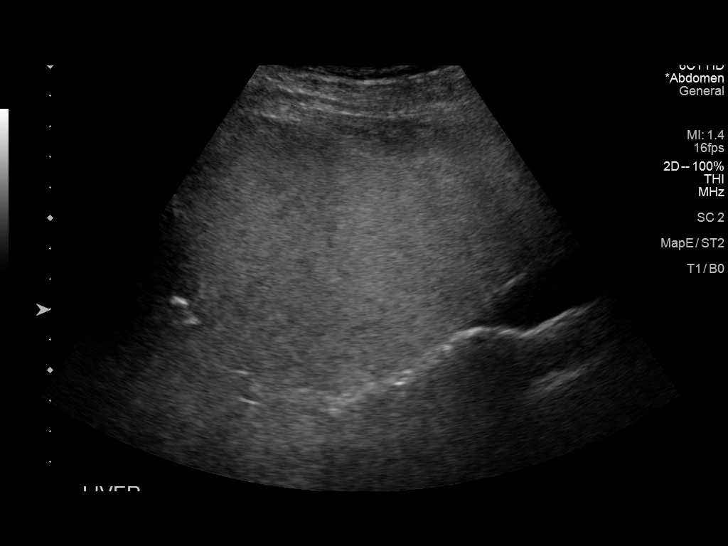
[im 29/43]
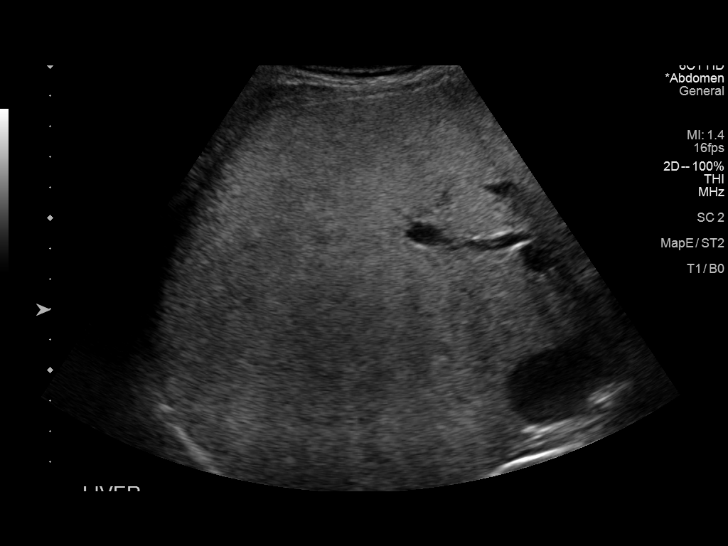
[im 32/43]
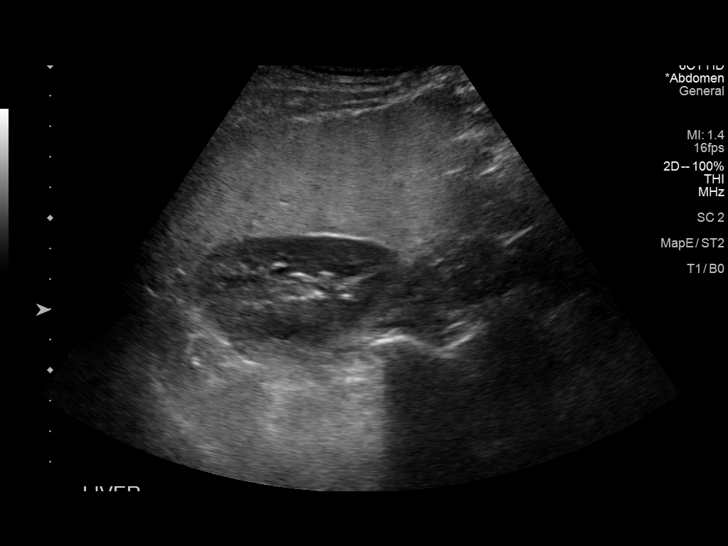
[im 36/43]
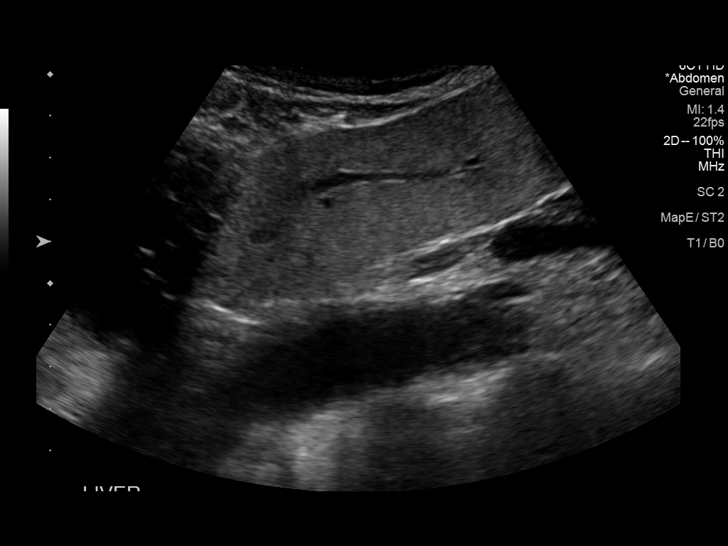
[im 39/43]
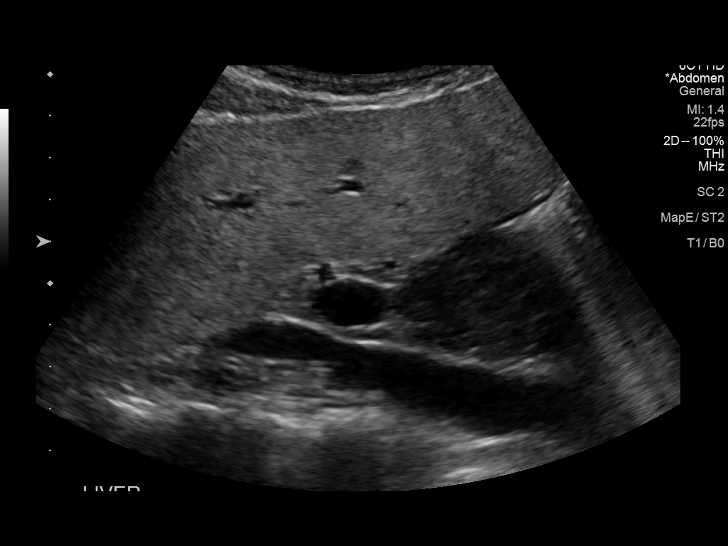
[im 43/43]
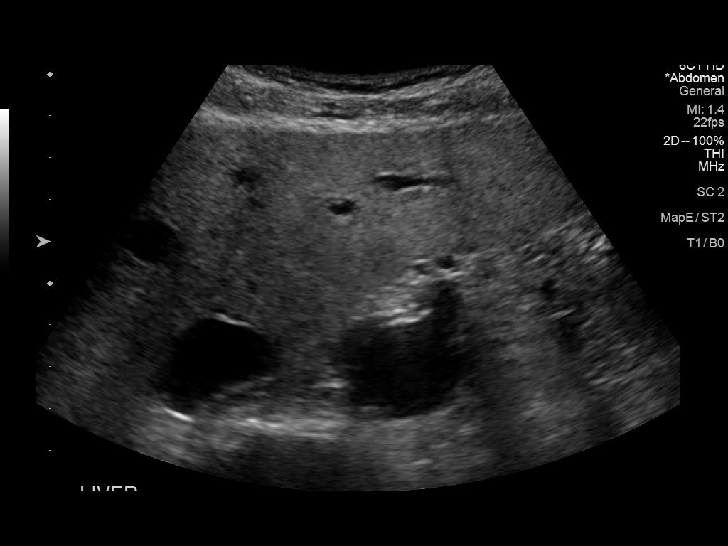

[14 of 25 positions shown; findings below may reference images not displayed]

FINDINGS: Gallbladder:

No gallstones or wall thickening visualized. No sonographic Murphy
sign noted by sonographer.

Common bile duct:

Diameter: 4.6 mm.  Normal.

Liver:

Slightly heterogeneous echotexture. This nonspecific finding can be
seen in a pedis cellular disease. No evidence of focal lesion.
Overall size of the organ is within normal limits. Portal vein is
patent on color Doppler imaging with normal direction of blood flow
towards the liver.

Other: None.
IMPRESSION: No gallbladder or ductal disease visible. Slightly heterogeneous
echotexture of the liver parenchyma which is nonspecific but can
indicate hepatocellular disease. Overall organ size is normal. No
focal lesion.
